# Patient Record
Sex: Male | Born: 1940
Health system: Southern US, Community
[De-identification: ages and names within clinical notes are randomized; demographics above are authoritative.]

## PROBLEM LIST (undated history)

## (undated) DIAGNOSIS — C189 Malignant neoplasm of colon, unspecified: Secondary | ICD-10-CM

## (undated) HISTORY — PX: COLON SURGERY: SHX602

## (undated) HISTORY — PX: COLECTOMY: SHX59

## (undated) HISTORY — PX: COLOSTOMY: SHX63

---

## 2009-03-28 ENCOUNTER — Ambulatory Visit: Payer: Self-pay | Admitting: Diagnostic Radiology

## 2009-03-28 ENCOUNTER — Emergency Department (HOSPITAL_BASED_OUTPATIENT_CLINIC_OR_DEPARTMENT_OTHER): Admission: EM | Admit: 2009-03-28 | Discharge: 2009-03-28 | Payer: Self-pay | Admitting: Emergency Medicine

## 2009-06-03 ENCOUNTER — Emergency Department (HOSPITAL_BASED_OUTPATIENT_CLINIC_OR_DEPARTMENT_OTHER): Admission: EM | Admit: 2009-06-03 | Discharge: 2009-06-04 | Payer: Self-pay | Admitting: Emergency Medicine

## 2009-06-04 ENCOUNTER — Ambulatory Visit: Payer: Self-pay | Admitting: Diagnostic Radiology

## 2010-04-14 ENCOUNTER — Ambulatory Visit: Payer: Self-pay | Admitting: Diagnostic Radiology

## 2010-04-14 ENCOUNTER — Emergency Department (HOSPITAL_BASED_OUTPATIENT_CLINIC_OR_DEPARTMENT_OTHER): Admission: EM | Admit: 2010-04-14 | Discharge: 2010-04-14 | Payer: Self-pay | Admitting: Emergency Medicine

## 2010-05-15 ENCOUNTER — Ambulatory Visit: Payer: Self-pay | Admitting: Family Medicine

## 2010-05-15 DIAGNOSIS — R079 Chest pain, unspecified: Secondary | ICD-10-CM | POA: Insufficient documentation

## 2010-05-15 DIAGNOSIS — S20219A Contusion of unspecified front wall of thorax, initial encounter: Secondary | ICD-10-CM

## 2010-05-15 DIAGNOSIS — I10 Essential (primary) hypertension: Secondary | ICD-10-CM | POA: Insufficient documentation

## 2010-05-15 DIAGNOSIS — E109 Type 1 diabetes mellitus without complications: Secondary | ICD-10-CM | POA: Insufficient documentation

## 2010-05-18 ENCOUNTER — Telehealth (INDEPENDENT_AMBULATORY_CARE_PROVIDER_SITE_OTHER): Payer: Self-pay | Admitting: *Deleted

## 2010-05-22 ENCOUNTER — Ambulatory Visit: Payer: Self-pay | Admitting: Emergency Medicine

## 2010-05-22 DIAGNOSIS — S335XXA Sprain of ligaments of lumbar spine, initial encounter: Secondary | ICD-10-CM

## 2010-10-03 NOTE — Progress Notes (Signed)
  Phone Note Outgoing Call Call back at [facility] Phone [phone]   Call placed by: Lajean Saver RN,  May 18, 2010 6:11 PM Call placed to: Patient  Follow-up for Phone Call        No answer. I left a message with reason for call. Call with any questions or concerns Follow-up by: Lajean Saver RN,  May 18, 2010 6:11 PM
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
## 2010-12-07 LAB — DIFFERENTIAL
Basophils Relative: 0 % (ref 0–1)
Eosinophils Absolute: 0 10*3/uL (ref 0.0–0.7)
Eosinophils Relative: 0 % (ref 0–5)
Lymphocytes Relative: 15 % (ref 12–46)
Lymphs Abs: 1.3 10*3/uL (ref 0.7–4.0)
Monocytes Absolute: 0.7 10*3/uL (ref 0.1–1.0)
Neutro Abs: 6.8 10*3/uL (ref 1.7–7.7)
Neutrophils Relative %: 77 % (ref 43–77)

## 2010-12-07 LAB — CBC
HCT: 40.4 % (ref 39.0–52.0)
Hemoglobin: 12.7 g/dL — ABNORMAL LOW (ref 13.0–17.0)
RDW: 15.8 % — ABNORMAL HIGH (ref 11.5–15.5)
WBC: 8.8 10*3/uL (ref 4.0–10.5)

## 2010-12-07 LAB — COMPREHENSIVE METABOLIC PANEL
BUN: 13 mg/dL (ref 6–23)
Calcium: 9.1 mg/dL (ref 8.4–10.5)
Creatinine, Ser: 1 mg/dL (ref 0.4–1.5)
Potassium: 3.6 mEq/L (ref 3.5–5.1)
Total Bilirubin: 0.3 mg/dL (ref 0.3–1.2)

## 2010-12-07 LAB — URINALYSIS, ROUTINE W REFLEX MICROSCOPIC
Bilirubin Urine: NEGATIVE
Glucose, UA: NEGATIVE mg/dL
pH: 6 (ref 5.0–8.0)

## 2010-12-10 LAB — DIFFERENTIAL
Basophils Relative: 1 % (ref 0–1)
Eosinophils Absolute: 0.1 10*3/uL (ref 0.0–0.7)
Lymphocytes Relative: 20 % (ref 12–46)
Lymphs Abs: 1.6 10*3/uL (ref 0.7–4.0)
Monocytes Absolute: 0.6 10*3/uL (ref 0.1–1.0)
Monocytes Relative: 8 % (ref 3–12)

## 2010-12-10 LAB — URINALYSIS, ROUTINE W REFLEX MICROSCOPIC
Glucose, UA: NEGATIVE mg/dL
Protein, ur: NEGATIVE mg/dL

## 2010-12-10 LAB — COMPREHENSIVE METABOLIC PANEL
ALT: 8 U/L (ref 0–53)
Albumin: 3.2 g/dL — ABNORMAL LOW (ref 3.5–5.2)
Alkaline Phosphatase: 76 U/L (ref 39–117)
CO2: 26 mEq/L (ref 19–32)
Calcium: 8.8 mg/dL (ref 8.4–10.5)
Chloride: 108 mEq/L (ref 96–112)
Creatinine, Ser: 0.8 mg/dL (ref 0.4–1.5)
GFR calc Af Amer: >60 mL/min (ref >60)
GFR calc non Af Amer: >60 mL/min (ref >60)
Potassium: 4.1 mEq/L (ref 3.5–5.1)
Total Bilirubin: 0.1 mg/dL — ABNORMAL LOW (ref 0.3–1.2)
Total Protein: 6.6 g/dL (ref 6.0–8.3)

## 2010-12-10 LAB — CBC
HCT: 33 % — ABNORMAL LOW (ref 39.0–52.0)
Platelets: 264 10*3/uL (ref 150–400)
RDW: 14.9 % (ref 11.5–15.5)
WBC: 7.7 10*3/uL (ref 4.0–10.5)

## 2010-12-10 LAB — APTT: aPTT: 31 seconds (ref 24–37)

## 2010-12-10 LAB — PROTIME-INR: INR: 1.1 (ref 0.00–1.49)

## 2012-01-06 ENCOUNTER — Emergency Department (INDEPENDENT_AMBULATORY_CARE_PROVIDER_SITE_OTHER): Payer: Medicare Other

## 2012-01-06 ENCOUNTER — Emergency Department (HOSPITAL_BASED_OUTPATIENT_CLINIC_OR_DEPARTMENT_OTHER)
Admission: EM | Admit: 2012-01-06 | Discharge: 2012-01-06 | Discharge status: Against Medical Advice | Payer: Medicare Other | Attending: Emergency Medicine | Admitting: Emergency Medicine

## 2012-01-06 ENCOUNTER — Encounter (HOSPITAL_BASED_OUTPATIENT_CLINIC_OR_DEPARTMENT_OTHER): Payer: Self-pay | Admitting: *Deleted

## 2012-01-06 DIAGNOSIS — R05 Cough: Secondary | ICD-10-CM | POA: Insufficient documentation

## 2012-01-06 DIAGNOSIS — R509 Fever, unspecified: Secondary | ICD-10-CM | POA: Insufficient documentation

## 2012-01-06 DIAGNOSIS — R0602 Shortness of breath: Secondary | ICD-10-CM | POA: Insufficient documentation

## 2012-01-06 DIAGNOSIS — Z933 Colostomy status: Secondary | ICD-10-CM | POA: Insufficient documentation

## 2012-01-06 DIAGNOSIS — R059 Cough, unspecified: Secondary | ICD-10-CM

## 2012-01-06 DIAGNOSIS — R0682 Tachypnea, not elsewhere classified: Secondary | ICD-10-CM | POA: Insufficient documentation

## 2012-01-06 DIAGNOSIS — I517 Cardiomegaly: Secondary | ICD-10-CM

## 2012-01-06 DIAGNOSIS — R350 Frequency of micturition: Secondary | ICD-10-CM | POA: Insufficient documentation

## 2012-01-06 DIAGNOSIS — J189 Pneumonia, unspecified organism: Secondary | ICD-10-CM | POA: Insufficient documentation

## 2012-01-06 HISTORY — DX: Malignant neoplasm of colon, unspecified: C18.9

## 2012-01-06 LAB — URINALYSIS, ROUTINE W REFLEX MICROSCOPIC
Bilirubin Urine: NEGATIVE
Nitrite: NEGATIVE
Urobilinogen, UA: 0.2 mg/dL (ref 0.0–1.0)

## 2012-01-06 LAB — DIFFERENTIAL
Basophils Absolute: 0.1 10*3/uL (ref 0.0–0.1)
Basophils Relative: 1 % (ref 0–1)
Lymphs Abs: 1.6 10*3/uL (ref 0.7–4.0)
Monocytes Absolute: 0.5 10*3/uL (ref 0.1–1.0)
Monocytes Relative: 4 % (ref 3–12)
Neutro Abs: 8.9 10*3/uL — ABNORMAL HIGH (ref 1.7–7.7)

## 2012-01-06 LAB — COMPREHENSIVE METABOLIC PANEL
ALT: 9 U/L (ref 0–53)
Albumin: 3.9 g/dL (ref 3.5–5.2)
Alkaline Phosphatase: 75 U/L (ref 39–117)
Calcium: 9.4 mg/dL (ref 8.4–10.5)
Chloride: 104 mEq/L (ref 96–112)
Creatinine, Ser: 0.8 mg/dL (ref 0.50–1.35)
GFR calc non Af Amer: 88 mL/min — ABNORMAL LOW (ref >90)
Potassium: 3.5 mEq/L (ref 3.5–5.1)
Sodium: 142 mEq/L (ref 135–145)
Total Protein: 7.1 g/dL (ref 6.0–8.3)

## 2012-01-06 LAB — CARDIAC PANEL(CRET KIN+CKTOT+MB+TROPI)
CK, MB: 3.2 ng/mL (ref 0.3–4.0)
Troponin I: <0.3 ng/mL (ref <0.30)

## 2012-01-06 MED ORDER — ALBUTEROL SULFATE HFA 108 (90 BASE) MCG/ACT IN AERS
2.0000 | INHALATION_SPRAY | RESPIRATORY_TRACT | Status: DC | PRN
  Administered 2012-01-06: 2 via RESPIRATORY_TRACT
  Filled 2012-01-06: qty 6.7

## 2012-01-06 MED ORDER — MOXIFLOXACIN HCL IN NACL 400 MG/250ML IV SOLN
400.0000 mg | Freq: Once | INTRAVENOUS | Status: AC
  Administered 2012-01-06: 400 mg via INTRAVENOUS
  Filled 2012-01-06: qty 250

## 2012-01-06 MED ORDER — MOXIFLOXACIN HCL 400 MG PO TABS: 400.0000 mg | ORAL_TABLET | Freq: Every day | ORAL | Status: AC

## 2012-01-06 MED ORDER — GUAIFENESIN 100 MG/5ML PO LIQD: 100.0000 mg | ORAL | Status: AC | PRN

## 2012-01-06 MED ORDER — ALBUTEROL SULFATE (5 MG/ML) 0.5% IN NEBU
5.0000 mg | INHALATION_SOLUTION | Freq: Once | RESPIRATORY_TRACT | Status: AC
  Administered 2012-01-06: 5 mg via RESPIRATORY_TRACT
  Filled 2012-01-06: qty 1

## 2012-01-06 NOTE — ED Notes (Signed)
MD informed patient that she would prefer he be admitted to hospital to continue treatment, but patient refused, education given on taking medications and instructed to contact MD or ER if condition worsens. Patient and family verbalized consent

## 2012-01-06 NOTE — Discharge Instructions (Signed)

## 2012-01-06 NOTE — ED Provider Notes (Addendum)
History     CSN: [id number]  Arrival date & time 01/06/12  0824   First MD Initiated Contact with Patient 01/06/12 0831      Chief Complaint  Patient presents with  . Cough  . Urinary Tract Infection    (Consider location/radiation/quality/duration/timing/severity/associated sxs/prior treatment) Patient is a 71 y.o. male presenting with cough and urinary tract infection. The history is provided by the patient.  Cough This is a new problem. The current episode started yesterday. The problem occurs constantly. The problem has been gradually worsening. The cough is non-productive. There has been no fever. Associated symptoms include chills, shortness of breath and wheezing. Pertinent negatives include no chest pain, no rhinorrhea and no sore throat. He has tried decongestants for the symptoms. The treatment provided no relief. Risk factors: No recent travel or other risk factors. He is not a smoker (Stopped smoking 20 years ago). His past medical history does not include bronchitis, COPD or asthma.  Urinary Tract Infection This is a new (Urinary urgency and frequency but no dysuria) problem. The current episode started 2 days ago. The problem occurs constantly. The problem has not changed since onset.Associated symptoms include shortness of breath. Pertinent negatives include no chest pain and no abdominal pain. The symptoms are aggravated by nothing. The symptoms are relieved by nothing. He has tried nothing for the symptoms. The treatment provided no relief.    No past medical history on file.  No past surgical history on file.  No family history on file.  History  Substance Use Topics  . Smoking status: Not on file  . Smokeless tobacco: Not on file  . Alcohol Use: Not on file      Review of Systems  Constitutional: Positive for chills.  HENT: Negative for congestion, sore throat and rhinorrhea.   Respiratory: Positive for cough, shortness of breath and wheezing.     Cardiovascular: Negative for chest pain, palpitations and leg swelling.  Gastrointestinal: Negative for nausea, vomiting, abdominal pain and diarrhea.  Genitourinary: Positive for urgency and frequency. Negative for dysuria.  Neurological: Negative for weakness.  All other systems reviewed and are negative.    Allergies  Review of patient's allergies indicates not on file.  Home Medications  No current outpatient prescriptions on file.  BP 160/87  Pulse 100  Temp(Src) 99.3 F (37.4 C) (Oral)  Resp 20  SpO2 94%  Physical Exam  Nursing note and vitals reviewed. Constitutional: He is oriented to person, place, and time. He appears well-developed and well-nourished. He appears distressed.  HENT:  Head: Normocephalic and atraumatic.  Mouth/Throat: Oropharynx is clear and moist.  Eyes: Conjunctivae and EOM are normal. Pupils are equal, round, and reactive to light.  Neck: Normal range of motion. Neck supple.  Cardiovascular: Normal rate, regular rhythm and intact distal pulses.   No murmur heard. Pulmonary/Chest: Tachypnea noted. No respiratory distress. He has wheezes. He has rhonchi. He has no rales.  Abdominal: Soft. He exhibits no distension. There is no tenderness. There is no rebound and no guarding.       Ostomy present in the left lower quadrant with normal stool in the bag  Musculoskeletal: Normal range of motion. He exhibits no edema and no tenderness.  Neurological: He is alert and oriented to person, place, and time.  Skin: Skin is warm and dry. No rash noted. No erythema.  Psychiatric: He has a normal mood and affect. His behavior is normal.    ED Course  Procedures (including critical care time)  Labs Reviewed  CBC - Abnormal; Notable for the following:    WBC 11.1 (*)    All other components within normal limits  DIFFERENTIAL - Abnormal; Notable for the following:    Neutrophils Relative 80 (*)    Neutro Abs 8.9 (*)    All other components within normal  limits  COMPREHENSIVE METABOLIC PANEL - Abnormal; Notable for the following:    Glucose, Bld 141 (*)    GFR calc non Af Amer 88 (*)    All other components within normal limits  URINALYSIS, ROUTINE W REFLEX MICROSCOPIC  CARDIAC PANEL(CRET KIN+CKTOT+MB+TROPI)   Dg Chest 2 View  01/06/2012  *RADIOLOGY REPORT*  Clinical Data: Cough  CHEST - 2 VIEW  Comparison: 05/15/2010  Findings: Right IJ PowerPort stable.  Linear scarring or atelectasis in the left lower lobe stable.  Heart size upper limits normal.  The right lung is clear.  No effusion.  Minimal spondylitic changes in the mid thoracic spine.  IMPRESSION:  1.  Stable mild cardiomegaly.  No acute disease.  Original Report Authenticated By: Osa Craver, M.D.     Date: 01/06/2012  Rate: 94  Rhythm: normal sinus rhythm  QRS Axis: normal  Intervals: normal  ST/T Wave abnormalities: nonspecific ST changes  Conduction Disutrbances:none  Narrative Interpretation:   Old EKG Reviewed: none available    1. CAP (community acquired pneumonia)       MDM   Patient with cough and shortness of breath that started yesterday. The cough is nonproductive however he has had febrile, tachycardic mildly hypoxic at 93-94% on room air today.  He should is audibly wheezing with crackles today diffusely on his lung exam. He has no abdominal pain or CVA tenderness and but states he feels he has a urinary tract infection because he's had urinary frequency. Patient denies any prior heart problems congestive heart failure and there is no sign of fluid overload such as JVD or peripheral edema. Feel patient's etiology is most likely reactive versus infectious. CBC, CMP, cardiac enzymes, UA, chest x-ray, EKG pending. Patient started on an albuterol nebulizer.  11:55 AM Tabs with a mild leukocytosis of 11,000 otherwise labs are unrevealing. Chest x-ray is within normal limits however field given patient's symptoms and exam that this is an early pneumonia  that is not become apparent on a chest x-ray.  Spoke with patient about feeling the need that he needs to be hospitalized due to his mild hypoxia on arrival and his persistent shortness of breath that had minimal improvement with albuterol. Still on exam now he has rhonchi and wheezing. However patient refuses to stay and wants to go home. He signed out AMA however he was given Avelox and inhaler.     Gwyneth Sprout, MD 01/06/12 1156  Gwyneth Sprout, MD 01/06/12 1158  Gwyneth Sprout, MD 01/06/12 1224

## 2012-01-06 NOTE — ED Notes (Signed)
Patient states that he started coughing yesterday, hurts when coughing/gagging & states it has grown worse, no n/v, he states that he feels like he has a UTI, hard to catch his breath

## 2012-11-09 ENCOUNTER — Emergency Department (HOSPITAL_BASED_OUTPATIENT_CLINIC_OR_DEPARTMENT_OTHER)
Admission: EM | Admit: 2012-11-09 | Discharge: 2012-11-09 | Disposition: A | Discharge status: Home / Self Care | Payer: Medicare Other | Attending: Emergency Medicine | Admitting: Emergency Medicine

## 2012-11-09 ENCOUNTER — Encounter (HOSPITAL_BASED_OUTPATIENT_CLINIC_OR_DEPARTMENT_OTHER): Payer: Self-pay

## 2012-11-09 ENCOUNTER — Emergency Department (HOSPITAL_BASED_OUTPATIENT_CLINIC_OR_DEPARTMENT_OTHER): Payer: Medicare Other

## 2012-11-09 DIAGNOSIS — Z85038 Personal history of other malignant neoplasm of large intestine: Secondary | ICD-10-CM | POA: Insufficient documentation

## 2012-11-09 DIAGNOSIS — Z87891 Personal history of nicotine dependence: Secondary | ICD-10-CM | POA: Insufficient documentation

## 2012-11-09 DIAGNOSIS — E119 Type 2 diabetes mellitus without complications: Secondary | ICD-10-CM | POA: Insufficient documentation

## 2012-11-09 DIAGNOSIS — M545 Low back pain, unspecified: Secondary | ICD-10-CM | POA: Insufficient documentation

## 2012-11-09 DIAGNOSIS — G8929 Other chronic pain: Secondary | ICD-10-CM | POA: Insufficient documentation

## 2012-11-09 DIAGNOSIS — Z79899 Other long term (current) drug therapy: Secondary | ICD-10-CM | POA: Insufficient documentation

## 2012-11-09 DIAGNOSIS — R109 Unspecified abdominal pain: Secondary | ICD-10-CM

## 2012-11-09 DIAGNOSIS — R52 Pain, unspecified: Secondary | ICD-10-CM | POA: Insufficient documentation

## 2012-11-09 LAB — COMPREHENSIVE METABOLIC PANEL
ALT: 7 U/L (ref 0–53)
AST: 15 U/L (ref 0–37)
Albumin: 3.7 g/dL (ref 3.5–5.2)
Alkaline Phosphatase: 65 U/L (ref 39–117)
BUN: 12 mg/dL (ref 6–23)
CO2: 26 mEq/L (ref 19–32)
Calcium: 9.6 mg/dL (ref 8.4–10.5)
Chloride: 102 mEq/L (ref 96–112)
Creatinine, Ser: 0.8 mg/dL (ref 0.50–1.35)
GFR calc Af Amer: >90 mL/min (ref >90)
GFR calc non Af Amer: 88 mL/min — ABNORMAL LOW (ref >90)
Glucose, Bld: 131 mg/dL — ABNORMAL HIGH (ref 70–99)
Potassium: 4.1 mEq/L (ref 3.5–5.1)
Sodium: 139 mEq/L (ref 135–145)
Total Bilirubin: 0.4 mg/dL (ref 0.3–1.2)
Total Protein: 7.3 g/dL (ref 6.0–8.3)

## 2012-11-09 LAB — CBC WITH DIFFERENTIAL/PLATELET
Basophils Absolute: 0 10*3/uL (ref 0.0–0.1)
Basophils Relative: 0 % (ref 0–1)
Eosinophils Absolute: 0.1 10*3/uL (ref 0.0–0.7)
Eosinophils Relative: 1 % (ref 0–5)
HCT: 44.9 % (ref 39.0–52.0)
Hemoglobin: 15 g/dL (ref 13.0–17.0)
Lymphocytes Relative: 22 % (ref 12–46)
Lymphs Abs: 1.6 10*3/uL (ref 0.7–4.0)
MCH: 27.6 pg (ref 26.0–34.0)
MCHC: 33.4 g/dL (ref 30.0–36.0)
MCV: 82.5 fL (ref 78.0–100.0)
Monocytes Absolute: 0.4 10*3/uL (ref 0.1–1.0)
Monocytes Relative: 6 % (ref 3–12)
Neutro Abs: 5.1 10*3/uL (ref 1.7–7.7)
Neutrophils Relative %: 71 % (ref 43–77)
Platelets: 194 10*3/uL (ref 150–400)
RBC: 5.44 MIL/uL (ref 4.22–5.81)
RDW: 14.2 % (ref 11.5–15.5)
WBC: 7.2 10*3/uL (ref 4.0–10.5)

## 2012-11-09 LAB — URINALYSIS, ROUTINE W REFLEX MICROSCOPIC
Bilirubin Urine: NEGATIVE
Hgb urine dipstick: NEGATIVE
Ketones, ur: NEGATIVE mg/dL
Specific Gravity, Urine: 1.023 (ref 1.005–1.030)
pH: 6 (ref 5.0–8.0)

## 2012-11-09 MED ORDER — ONDANSETRON HCL 4 MG/2ML IJ SOLN
INTRAMUSCULAR | Status: AC
  Filled 2012-11-09: qty 2

## 2012-11-09 MED ORDER — IOHEXOL 300 MG/ML  SOLN
50.0000 mL | Freq: Once | INTRAMUSCULAR | Status: AC | PRN
  Administered 2012-11-09: 50 mL via ORAL

## 2012-11-09 MED ORDER — IOHEXOL 300 MG/ML  SOLN
100.0000 mL | Freq: Once | INTRAMUSCULAR | Status: AC | PRN
  Administered 2012-11-09: 100 mL via INTRAVENOUS

## 2012-11-09 MED ORDER — OXYCODONE-ACETAMINOPHEN 5-325 MG PO TABS: 1.0000 | ORAL_TABLET | Freq: Four times a day (QID) | ORAL | Status: DC | PRN

## 2012-11-09 MED ORDER — FENTANYL CITRATE 0.05 MG/ML IJ SOLN
100.0000 ug | Freq: Once | INTRAMUSCULAR | Status: AC
  Administered 2012-11-09: 100 ug via INTRAVENOUS
  Filled 2012-11-09: qty 2

## 2012-11-09 MED ORDER — SODIUM CHLORIDE 0.9 % IV BOLUS (SEPSIS)
1000.0000 mL | Freq: Once | INTRAVENOUS | Status: AC
  Administered 2012-11-09: 1000 mL via INTRAVENOUS

## 2012-11-09 MED ORDER — ONDANSETRON HCL 4 MG/2ML IJ SOLN
4.0000 mg | Freq: Once | INTRAMUSCULAR | Status: AC
  Administered 2012-11-09: 4 mg via INTRAVENOUS
  Filled 2012-11-09: qty 2

## 2012-11-09 NOTE — ED Provider Notes (Signed)
History     CSN: [id number]  Arrival date & time 11/09/12  1221   First MD Initiated Contact with Patient 11/09/12 1359      Chief Complaint  Patient presents with  . Flank Pain    (Consider location/radiation/quality/duration/timing/severity/associated sxs/prior treatment) HPI Patient presents to the emergency department with right side pain.  Patient, states, that this began yesterday, but got worse this morning.  Patient, states, that he is on chronic pain medications.  Patient, states he did not have nausea, vomiting, diarrhea, fever, chest pain, shortness breath, weakness, numbness, dysuria, dizziness, or syncope.  Patient, states, that he has a history of colon cancer.  Patient denies any relieving factors.  Past Medical History  Diagnosis Date  . Colon cancer   . Diabetes mellitus     Past Surgical History  Procedure Laterality Date  . Colon surgery    . Colectomy    . Colectomy      History reviewed. No pertinent family history.  History  Substance Use Topics  . Smoking status: Former Games developer  . Smokeless tobacco: Not on file  . Alcohol Use: No      Review of Systems All other systems negative except as documented in the HPI. All pertinent positives and negatives as reviewed in the HPI. Allergies  Review of patient's allergies indicates no known allergies.  Home Medications   Current Outpatient Rx  Name  Route  Sig  Dispense  Refill  . citalopram (CELEXA) 20 MG tablet   Oral   Take 20 mg by mouth daily.         . fentaNYL (DURAGESIC - DOSED MCG/HR) 50 MCG/HR   Transdermal   Place 1 patch onto the skin every 3 (three) days.         . ferrous sulfate 325 (65 FE) MG tablet   Oral   Take 325 mg by mouth daily with breakfast.         . gabapentin (NEURONTIN) 300 MG capsule   Oral   Take 300 mg by mouth 3 (three) times daily.         Marland Kitchen glipiZIDE (GLUCOTROL) 5 MG tablet   Oral   Take 5 mg by mouth 2 (two) times daily before a meal.         . HYDROcodone-acetaminophen (NORCO) 5-325 MG per tablet   Oral   Take 1 tablet by mouth every 4 (four) hours as needed.         Marland Kitchen lisinopril (PRINIVIL,ZESTRIL) 10 MG tablet   Oral   Take 10 mg by mouth daily.         Marland Kitchen loperamide (IMODIUM) 2 MG capsule   Oral   Take 2 mg by mouth 4 (four) times daily as needed.         . methadone (DOLOPHINE) 5 MG tablet   Oral   Take 5 mg by mouth 2 (two) times daily.         Marland Kitchen omeprazole (PRILOSEC) 20 MG capsule   Oral   Take 20 mg by mouth daily.           BP 117/75  Pulse 58  Temp(Src) 98.1 F (36.7 C) (Oral)  Resp 18  Ht 5\' 9"  (1.753 m)  Wt 203 lb (92.08 kg)  BMI 29.96 kg/m2  SpO2 96%  Physical Exam  Constitutional: He is oriented to person, place, and time. He appears well-developed and well-nourished. No distress.  HENT:  Head: Normocephalic and atraumatic.  Mouth/Throat: Oropharynx  is clear and moist.  Neck: Normal range of motion. Neck supple.  Cardiovascular: Normal rate, regular rhythm and normal heart sounds.  Exam reveals no gallop and no friction rub.   No murmur heard. Pulmonary/Chest: Effort normal and breath sounds normal. No respiratory distress. He has no wheezes. He has no rales.  Abdominal: Soft. Normal appearance and bowel sounds are normal. He exhibits no distension. There is no hepatosplenomegaly. There is no tenderness. There is no guarding and no CVA tenderness.    Neurological: He is alert and oriented to person, place, and time.    ED Course  Procedures (including critical care time)  Labs Reviewed  COMPREHENSIVE METABOLIC PANEL - Abnormal; Notable for the following:    Glucose, Bld 131 (*)    GFR calc non Af Amer 88 (*)    All other components within normal limits  URINALYSIS, ROUTINE W REFLEX MICROSCOPIC  CBC WITH DIFFERENTIAL   Ct Abdomen Pelvis W Contrast  11/09/2012  *RADIOLOGY REPORT*  Clinical Data: Right flank pain, nausea.  History of colon cancer status post resection.  CT  ABDOMEN AND PELVIS WITH CONTRAST  Technique:  Multidetector CT imaging of the abdomen and pelvis was performed following the standard protocol during bolus administration of intravenous contrast.  Contrast: 100 ml Omnipaque-300 IV  Comparison: 06/04/2009  Findings: Lung bases are clear.  8 mm probable cyst in the lateral segment left hepatic lobe (series 2/image 8).  Spleen and adrenal glands are within normal limits.  10 mm cystic lesion in the pancreatic tail (series 2/image 22). This appear is more conspicuous on the current study but was likely present in 2010.  No associated main pancreatic ductal dilatation.  Gallbladder is unremarkable.  No intrahepatic or extrahepatic ductal dilatation.  Tiny cortical defect/cyst in the lateral right lower kidney (series 2/image 27).  Left renal sinus cysts.  No hydronephrosis.  No evidence of bowel obstruction.  Normal appendix.  Left lower quadrant colostomy with Hartmann's pouch.  Colonic diverticulosis, without associated inflammatory changes.  No evidence of abdominal aortic aneurysm.  Retroaortic left renal vein.  No abdominopelvic ascites.  No suspicious abdominopelvic lymphadenopathy.  Prostatomegaly, measuring 5.8 cm, and indenting the base of the bladder.  No ureteral or bladder calculi.  Bladder is within normal limits.  Moderate fat-containing right inguinal hernia with mild stranding, unchanged.  Small fat-containing left lower hernia.  Degenerative changes of the visualized thoracolumbar spine.  IMPRESSION: No evidence of bowel obstruction.  Normal appendix.  Left lower quadrant colostomy with Hartmann's pouch.  10 mm nonaggressive cystic lesion in the pancreatic tail, similar to 2010, likely benign.  Consider a single follow-up MRI/MCRP abdomen with/without contrast in 12 months if clinically warranted.  No findings to suggest metastatic disease in the abdomen/pelvis.   Original Report Authenticated By: Charline Bills, M.D.      Recheck.  Patient.  X3.   The patient is feeling better and would like to go home at this time.  Patient is advised followup with his primary care Dr. told to return here as needed.  Patient is advised to slowly increase his fluid intake.  Also vessel giving limits quality of pain medication as needed.  Is most likely related to pain.  The patient has had previously in this area MDM  MDM Reviewed: vitals and nursing note Interpretation: labs and CT scan            Carlyle Dolly, PA-C 11/09/12 1719  Carlyle Dolly, PA-C 11/09/12 1723

## 2012-11-09 NOTE — ED Provider Notes (Signed)
Medical screening examination/treatment/procedure(s) were performed by non-physician practitioner and as supervising physician I was immediately available for consultation/collaboration.  Geoffery Lyons, MD 11/09/12 [phone]

## 2012-11-09 NOTE — ED Notes (Signed)
Pt states that he has been helping put carpet down this past week,  Onset of R flank pain 4 days ago.  Pt states that pain has increased gradually over the past four days.  Pt denies urinary symptoms.

## 2012-11-09 NOTE — ED Notes (Signed)
Patient transported to CT 

## 2012-11-09 NOTE — ED Notes (Signed)
Pt back from CT

## 2015-09-14 DIAGNOSIS — E785 Hyperlipidemia, unspecified: Secondary | ICD-10-CM | POA: Diagnosis not present

## 2015-09-14 DIAGNOSIS — R0989 Other specified symptoms and signs involving the circulatory and respiratory systems: Secondary | ICD-10-CM | POA: Diagnosis not present

## 2015-09-14 DIAGNOSIS — I10 Essential (primary) hypertension: Secondary | ICD-10-CM | POA: Diagnosis not present

## 2015-09-14 DIAGNOSIS — E119 Type 2 diabetes mellitus without complications: Secondary | ICD-10-CM | POA: Diagnosis not present

## 2015-09-14 DIAGNOSIS — R05 Cough: Secondary | ICD-10-CM | POA: Diagnosis not present

## 2015-09-15 DIAGNOSIS — C786 Secondary malignant neoplasm of retroperitoneum and peritoneum: Secondary | ICD-10-CM | POA: Diagnosis not present

## 2015-09-15 DIAGNOSIS — G894 Chronic pain syndrome: Secondary | ICD-10-CM | POA: Diagnosis not present

## 2015-09-15 DIAGNOSIS — Z79891 Long term (current) use of opiate analgesic: Secondary | ICD-10-CM | POA: Diagnosis not present

## 2015-09-15 DIAGNOSIS — Z9221 Personal history of antineoplastic chemotherapy: Secondary | ICD-10-CM | POA: Diagnosis not present

## 2015-09-15 DIAGNOSIS — C189 Malignant neoplasm of colon, unspecified: Secondary | ICD-10-CM | POA: Diagnosis not present

## 2015-09-15 DIAGNOSIS — Z79899 Other long term (current) drug therapy: Secondary | ICD-10-CM | POA: Diagnosis not present

## 2015-09-15 DIAGNOSIS — R05 Cough: Secondary | ICD-10-CM | POA: Diagnosis not present

## 2015-09-15 DIAGNOSIS — Z9889 Other specified postprocedural states: Secondary | ICD-10-CM | POA: Diagnosis not present

## 2015-09-28 DIAGNOSIS — H401132 Primary open-angle glaucoma, bilateral, moderate stage: Secondary | ICD-10-CM | POA: Diagnosis not present

## 2015-10-13 DIAGNOSIS — J069 Acute upper respiratory infection, unspecified: Secondary | ICD-10-CM | POA: Diagnosis not present

## 2015-10-13 DIAGNOSIS — E119 Type 2 diabetes mellitus without complications: Secondary | ICD-10-CM | POA: Diagnosis not present

## 2015-10-14 DIAGNOSIS — Z933 Colostomy status: Secondary | ICD-10-CM | POA: Diagnosis not present

## 2015-11-10 DIAGNOSIS — Z79899 Other long term (current) drug therapy: Secondary | ICD-10-CM | POA: Diagnosis not present

## 2015-11-10 DIAGNOSIS — C189 Malignant neoplasm of colon, unspecified: Secondary | ICD-10-CM | POA: Diagnosis not present

## 2015-12-07 DIAGNOSIS — G894 Chronic pain syndrome: Secondary | ICD-10-CM | POA: Diagnosis not present

## 2015-12-07 DIAGNOSIS — G893 Neoplasm related pain (acute) (chronic): Secondary | ICD-10-CM | POA: Diagnosis not present

## 2015-12-07 DIAGNOSIS — Z79891 Long term (current) use of opiate analgesic: Secondary | ICD-10-CM | POA: Diagnosis not present

## 2015-12-07 DIAGNOSIS — C189 Malignant neoplasm of colon, unspecified: Secondary | ICD-10-CM | POA: Diagnosis not present

## 2015-12-07 DIAGNOSIS — C786 Secondary malignant neoplasm of retroperitoneum and peritoneum: Secondary | ICD-10-CM | POA: Diagnosis not present

## 2015-12-07 DIAGNOSIS — Z79899 Other long term (current) drug therapy: Secondary | ICD-10-CM | POA: Diagnosis not present

## 2015-12-29 DIAGNOSIS — Z452 Encounter for adjustment and management of vascular access device: Secondary | ICD-10-CM | POA: Diagnosis not present

## 2015-12-29 DIAGNOSIS — C189 Malignant neoplasm of colon, unspecified: Secondary | ICD-10-CM | POA: Diagnosis not present

## 2016-01-03 DIAGNOSIS — Z933 Colostomy status: Secondary | ICD-10-CM | POA: Diagnosis not present

## 2016-02-08 DIAGNOSIS — H401132 Primary open-angle glaucoma, bilateral, moderate stage: Secondary | ICD-10-CM | POA: Diagnosis not present

## 2016-03-27 DIAGNOSIS — G893 Neoplasm related pain (acute) (chronic): Secondary | ICD-10-CM | POA: Diagnosis not present

## 2016-03-27 DIAGNOSIS — Z933 Colostomy status: Secondary | ICD-10-CM | POA: Diagnosis not present

## 2016-03-27 DIAGNOSIS — C189 Malignant neoplasm of colon, unspecified: Secondary | ICD-10-CM | POA: Diagnosis not present

## 2016-03-27 DIAGNOSIS — Z79899 Other long term (current) drug therapy: Secondary | ICD-10-CM | POA: Diagnosis not present

## 2016-03-27 DIAGNOSIS — G894 Chronic pain syndrome: Secondary | ICD-10-CM | POA: Diagnosis not present

## 2016-03-27 DIAGNOSIS — C786 Secondary malignant neoplasm of retroperitoneum and peritoneum: Secondary | ICD-10-CM | POA: Diagnosis not present

## 2016-03-27 DIAGNOSIS — Z888 Allergy status to other drugs, medicaments and biological substances status: Secondary | ICD-10-CM | POA: Diagnosis not present

## 2016-03-27 DIAGNOSIS — Z79891 Long term (current) use of opiate analgesic: Secondary | ICD-10-CM | POA: Diagnosis not present

## 2016-05-20 ENCOUNTER — Emergency Department (HOSPITAL_BASED_OUTPATIENT_CLINIC_OR_DEPARTMENT_OTHER): Payer: Medicare Other

## 2016-05-20 ENCOUNTER — Emergency Department (HOSPITAL_BASED_OUTPATIENT_CLINIC_OR_DEPARTMENT_OTHER)
Admission: EM | Admit: 2016-05-20 | Discharge: 2016-05-20 | Disposition: A | Discharge status: Home / Self Care | Payer: Medicare Other | Attending: Emergency Medicine | Admitting: Emergency Medicine

## 2016-05-20 ENCOUNTER — Encounter (HOSPITAL_BASED_OUTPATIENT_CLINIC_OR_DEPARTMENT_OTHER): Payer: Self-pay | Admitting: Emergency Medicine

## 2016-05-20 DIAGNOSIS — D492 Neoplasm of unspecified behavior of bone, soft tissue, and skin: Secondary | ICD-10-CM | POA: Diagnosis not present

## 2016-05-20 DIAGNOSIS — Z79899 Other long term (current) drug therapy: Secondary | ICD-10-CM | POA: Diagnosis not present

## 2016-05-20 DIAGNOSIS — M25552 Pain in left hip: Secondary | ICD-10-CM | POA: Diagnosis not present

## 2016-05-20 DIAGNOSIS — C7951 Secondary malignant neoplasm of bone: Secondary | ICD-10-CM | POA: Insufficient documentation

## 2016-05-20 DIAGNOSIS — I10 Essential (primary) hypertension: Secondary | ICD-10-CM | POA: Diagnosis not present

## 2016-05-20 DIAGNOSIS — Z7984 Long term (current) use of oral hypoglycemic drugs: Secondary | ICD-10-CM | POA: Insufficient documentation

## 2016-05-20 DIAGNOSIS — R110 Nausea: Secondary | ICD-10-CM | POA: Diagnosis not present

## 2016-05-20 DIAGNOSIS — C189 Malignant neoplasm of colon, unspecified: Secondary | ICD-10-CM | POA: Insufficient documentation

## 2016-05-20 DIAGNOSIS — R109 Unspecified abdominal pain: Secondary | ICD-10-CM | POA: Diagnosis present

## 2016-05-20 DIAGNOSIS — E119 Type 2 diabetes mellitus without complications: Secondary | ICD-10-CM | POA: Insufficient documentation

## 2016-05-20 DIAGNOSIS — R5383 Other fatigue: Secondary | ICD-10-CM | POA: Diagnosis not present

## 2016-05-20 LAB — COMPREHENSIVE METABOLIC PANEL
ALBUMIN: 2.9 g/dL — AB (ref 3.5–5.0)
ALT: 5 U/L — AB (ref 17–63)
AST: 13 U/L — AB (ref 15–41)
Alkaline Phosphatase: 82 U/L (ref 38–126)
Anion gap: 5 (ref 5–15)
BILIRUBIN TOTAL: 0.5 mg/dL (ref 0.3–1.2)
BUN: 10 mg/dL (ref 6–20)
CO2: 32 mmol/L (ref 22–32)
CREATININE: 0.73 mg/dL (ref 0.61–1.24)
Calcium: 9 mg/dL (ref 8.9–10.3)
Chloride: 103 mmol/L (ref 101–111)
GFR calc Af Amer: >60 mL/min (ref >60)
GFR calc non Af Amer: >60 mL/min (ref >60)
GLUCOSE: 108 mg/dL — AB (ref 65–99)
POTASSIUM: 3.9 mmol/L (ref 3.5–5.1)
Sodium: 140 mmol/L (ref 135–145)
TOTAL PROTEIN: 6.9 g/dL (ref 6.5–8.1)

## 2016-05-20 LAB — URINALYSIS, ROUTINE W REFLEX MICROSCOPIC
Bilirubin Urine: NEGATIVE
Glucose, UA: NEGATIVE mg/dL
Hgb urine dipstick: NEGATIVE
KETONES UR: NEGATIVE mg/dL
LEUKOCYTES UA: NEGATIVE
NITRITE: NEGATIVE
PROTEIN: NEGATIVE mg/dL
Specific Gravity, Urine: 1.022 (ref 1.005–1.030)
pH: 6.5 (ref 5.0–8.0)

## 2016-05-20 LAB — CBC WITH DIFFERENTIAL/PLATELET
BASOS ABS: 0 10*3/uL (ref 0.0–0.1)
BASOS PCT: 0 %
Eosinophils Absolute: 0.1 10*3/uL (ref 0.0–0.7)
Eosinophils Relative: 1 %
HEMATOCRIT: 33.9 % — AB (ref 39.0–52.0)
HEMOGLOBIN: 10.9 g/dL — AB (ref 13.0–17.0)
LYMPHS PCT: 18 %
Lymphs Abs: 1 10*3/uL (ref 0.7–4.0)
MCH: 25.5 pg — ABNORMAL LOW (ref 26.0–34.0)
MCHC: 32.2 g/dL (ref 30.0–36.0)
MCV: 79.2 fL (ref 78.0–100.0)
MONO ABS: 0.5 10*3/uL (ref 0.1–1.0)
Monocytes Relative: 10 %
NEUTROS ABS: 4 10*3/uL (ref 1.7–7.7)
NEUTROS PCT: 71 %
Platelets: 305 10*3/uL (ref 150–400)
RBC: 4.28 MIL/uL (ref 4.22–5.81)
RDW: 16.9 % — AB (ref 11.5–15.5)
WBC: 5.6 10*3/uL (ref 4.0–10.5)

## 2016-05-20 MED ORDER — KETOROLAC TROMETHAMINE 30 MG/ML IJ SOLN
10.0000 mg | Freq: Once | INTRAMUSCULAR | Status: AC
  Administered 2016-05-20: 9.9 mg via INTRAVENOUS
  Filled 2016-05-20: qty 1

## 2016-05-20 MED ORDER — MORPHINE SULFATE (PF) 4 MG/ML IV SOLN: 6.0000 mg | Freq: Once | INTRAVENOUS | Status: DC

## 2016-05-20 MED ORDER — HYDROCODONE-ACETAMINOPHEN 5-325 MG PO TABS: 1.0000 | ORAL_TABLET | ORAL | 0 refills | Status: AC | PRN

## 2016-05-20 NOTE — ED Notes (Signed)
MD at bedside. 

## 2016-05-20 NOTE — ED Triage Notes (Addendum)
Intermittent pain from L arm down to L leg. Pt has "pain pill" without relief. No injury.

## 2016-05-20 NOTE — ED Notes (Signed)
Pt able to slowly ambulate with no assistance.

## 2016-05-20 NOTE — ED Notes (Signed)
Patient transported to CT 

## 2016-05-20 NOTE — ED Provider Notes (Signed)
[facility] Provider Note   CSN: [id number] Arrival date & time: 05/20/16  1001     History   Chief Complaint Chief Complaint  Patient presents with  . L sided pain    HPI Dakota Malone is a 75 y.o. male.   Hip Pain  This is a new problem. The current episode started more than 1 week ago. The problem occurs daily. The problem has not changed since onset.Associated symptoms include abdominal pain (2/2 metastatic colon cancer). Pertinent negatives include no shortness of breath. Exacerbated by: rest. The symptoms are relieved by narcotics.    Past Medical History:  Diagnosis Date  . Colon cancer ([place])   . Diabetes mellitus     Patient Active Problem List   Diagnosis Date Noted  . LUMBAR SPRAIN AND STRAIN 05/22/2010  . DIABETES MELLITUS, TYPE I 05/15/2010  . HYPERTENSION 05/15/2010  . RIB PAIN, LEFT SIDED 05/15/2010  . CONTUSION, LEFT CHEST WALL 05/15/2010    Past Surgical History:  Procedure Laterality Date  . COLECTOMY    . COLECTOMY    . COLON SURGERY    . COLOSTOMY         Home Medications    Prior to Admission medications   Medication Sig Start Date End Date Taking? Authorizing Provider  citalopram (CELEXA) 20 MG tablet Take 20 mg by mouth daily.    Historical Provider, MD  fentaNYL (DURAGESIC - DOSED MCG/HR) 50 MCG/HR Place 1 patch onto the skin every 3 (three) days.    Historical Provider, MD  ferrous sulfate 325 (65 FE) MG tablet Take 325 mg by mouth daily with breakfast.    Historical Provider, MD  gabapentin (NEURONTIN) 300 MG capsule Take 300 mg by mouth 3 (three) times daily.    Historical Provider, MD  glipiZIDE (GLUCOTROL) 5 MG tablet Take 5 mg by mouth 2 (two) times daily before a meal.    Historical Provider, MD  HYDROcodone-acetaminophen (NORCO/VICODIN) 5-325 MG tablet Take 1 tablet by mouth every 4 (four) hours as needed. 05/20/16   Merrily Pew, MD  lisinopril (PRINIVIL,ZESTRIL) 10 MG tablet Take 10 mg by mouth daily.     Historical Provider, MD  loperamide (IMODIUM) 2 MG capsule Take 2 mg by mouth 4 (four) times daily as needed.    Historical Provider, MD  methadone (DOLOPHINE) 5 MG tablet Take 5 mg by mouth 2 (two) times daily.    Historical Provider, MD  omeprazole (PRILOSEC) 20 MG capsule Take 20 mg by mouth daily.    Historical Provider, MD  oxyCODONE-acetaminophen (PERCOCET/ROXICET) 5-325 MG per tablet Take 1 tablet by mouth every 6 (six) hours as needed for pain. 11/09/12   Dalia Heading, PA-C    Family History No family history on file.  Social History Social History  Substance Use Topics  . Smoking status: Former Research scientist (life sciences)  . Smokeless tobacco: Never Used  . Alcohol use No     Allergies   Review of patient's allergies indicates no known allergies.   Review of Systems Review of Systems  Constitutional: Positive for fatigue. Negative for unexpected weight change.       Night sweats x 5 days  Respiratory: Negative for cough and shortness of breath.   Gastrointestinal: Positive for abdominal pain (2/2 metastatic colon cancer) and nausea. Negative for vomiting.  Genitourinary: Negative for difficulty urinating.  Musculoskeletal: Negative for neck pain.       Left hip pain sometimes radiates to left leg  All other systems reviewed and are negative.  Physical Exam Updated Vital Signs BP 145/79 (BP Location: Right Arm)   Pulse (!) 52   Temp 98.5 F (36.9 C) (Oral)   Resp 16   Ht 5\' 9"  (1.753 m)   Wt 169 lb (76.7 kg)   SpO2 100%   BMI 24.96 kg/m   Physical Exam  Constitutional: He appears well-developed and well-nourished.  HENT:  Head: Normocephalic and atraumatic.  Eyes: Conjunctivae are normal.  Neck: Neck supple.  Cardiovascular: Normal rate and regular rhythm.   No murmur heard. Pulmonary/Chest: Effort normal and breath sounds normal. No respiratory distress.  Abdominal: Soft. There is no tenderness.  Musculoskeletal: He exhibits no edema.  Neurological: He is alert.    Skin: Skin is warm and dry.  Psychiatric: He has a normal mood and affect.  Nursing note and vitals reviewed.    ED Treatments / Results  Labs (all labs ordered are listed, but only abnormal results are displayed) Labs Reviewed  CBC WITH DIFFERENTIAL/PLATELET - Abnormal; Notable for the following:       Result Value   Hemoglobin 10.9 (*)    HCT 33.9 (*)    MCH 25.5 (*)    RDW 16.9 (*)    All other components within normal limits  COMPREHENSIVE METABOLIC PANEL - Abnormal; Notable for the following:    Glucose, Bld 108 (*)    Albumin 2.9 (*)    AST 13 (*)    ALT 5 (*)    All other components within normal limits  URINALYSIS, ROUTINE W REFLEX MICROSCOPIC (NOT AT [facility])    EKG  EKG Interpretation None       Radiology Ct Pelvis Wo Contrast  Result Date: 05/20/2016 CLINICAL DATA:  LEFT hip pain.  Abnormal radiograph EXAM: CT PELVIS WITHOUT CONTRAST TECHNIQUE: Multidetector CT imaging of the pelvis was performed following the standard protocol without intravenous contrast. COMPARISON:  Radiograph 05/20/2016, CT 04/14/2014 FINDINGS: Urinary Tract:  Unremarkable Bowel: Residual barium contrast within the excluded rectosigmoid colon. Several diverticula of the sigmoid colon. No acute inflammation. Vascular/Lymphatic: abdominal aorta is calcified. No retroperitoneal adenopathy. Reproductive:  Prostate mildly enlarged at 15 mm. Other:  LEFT lower quadrant colostomy Musculoskeletal: There is a permeative lesion involving the entirety of the LEFT iliac crests with a robust periosteal reaction consistent with aggressive osseous lesion. There is expansion into adjacent iliacus muscle measuring 3 cm in thickness compared to the normal 1.3 cm thick iliacus muscle on the RIGHT. IMPRESSION: 1. Aggressive lesion in the LEFT iliac bone with robust periosteal reaction is most consistent with neoplasm. Favor metastatic lesion over a primary bone lesion. 2. Inflammation / extension to the adjacent  musculature. Electronically Signed   By: Suzy Bouchard M.D.   On: 05/20/2016 12:59   Dg Hip Unilat W Or Wo Pelvis 2-3 Views Left  Result Date: 05/20/2016 CLINICAL DATA:  Left hip pain for several days. No known injury. History of colon cancer. EXAM: DG HIP (WITH OR WITHOUT PELVIS) 2-3V LEFT COMPARISON:  04/14/2014 CT FINDINGS: There is no evidence of hip fracture, subluxation or dislocation. Mild degenerative changes in the hips noted. There is increased irregular density overlying the left iliac crest. This may be related to this patient's colostomy, but difficult to exclude a bony lesion on this study. No other focal bony abnormalities are noted. IMPRESSION: No evidence of acute hip abnormality. Irregular increased density overlying the left iliac crest. Although this may be related to this patient's colostomy, a focal bony lesion is difficult to exclude. Consider dedicated  and oblique views as indicated. Electronically Signed   By: Margarette Canada M.D.   On: 05/20/2016 11:52    Procedures Procedures (including critical care time)  Medications Ordered in ED Medications  ketorolac (TORADOL) 30 MG/ML injection 9.9 mg (9.9 mg Intravenous Given 05/20/16 1130)     Initial Impression / Assessment and Plan / ED Course  I have reviewed the triage vital signs and the nursing notes.  Pertinent labs & imaging results that were available during my care of the patient were reviewed by me and considered in my medical decision making (see chart for details).  Clinical Course   Suspect OA, but has stage IV colon cancer, will eval for metastatic to bone.   CT w/ e/o metastatic cancer, likely cause of pain, will increase amount of hydrocodone at home will dw his oncologist tomorrow.   Final Clinical Impressions(s) / ED Diagnoses   Final diagnoses:  Metastatic cancer to bone [facility])    New Prescriptions Discharge Medication List as of 05/20/2016  3:47 PM       Merrily Pew, MD 05/20/16 2008

## 2016-05-20 NOTE — ED Notes (Signed)
Patient transported to X-ray 

## 2016-05-24 DIAGNOSIS — Z79899 Other long term (current) drug therapy: Secondary | ICD-10-CM | POA: Diagnosis not present

## 2016-05-24 DIAGNOSIS — M25551 Pain in right hip: Secondary | ICD-10-CM | POA: Diagnosis not present

## 2016-05-24 DIAGNOSIS — G893 Neoplasm related pain (acute) (chronic): Secondary | ICD-10-CM | POA: Diagnosis not present

## 2016-05-24 DIAGNOSIS — C189 Malignant neoplasm of colon, unspecified: Secondary | ICD-10-CM | POA: Diagnosis not present

## 2016-05-24 DIAGNOSIS — Z9221 Personal history of antineoplastic chemotherapy: Secondary | ICD-10-CM | POA: Diagnosis not present

## 2016-06-08 DIAGNOSIS — Z9221 Personal history of antineoplastic chemotherapy: Secondary | ICD-10-CM | POA: Diagnosis not present

## 2016-06-08 DIAGNOSIS — Z7984 Long term (current) use of oral hypoglycemic drugs: Secondary | ICD-10-CM | POA: Diagnosis not present

## 2016-06-08 DIAGNOSIS — Z510 Encounter for antineoplastic radiation therapy: Secondary | ICD-10-CM | POA: Diagnosis not present

## 2016-06-08 DIAGNOSIS — M79652 Pain in left thigh: Secondary | ICD-10-CM | POA: Diagnosis not present

## 2016-06-08 DIAGNOSIS — M4802 Spinal stenosis, cervical region: Secondary | ICD-10-CM | POA: Diagnosis not present

## 2016-06-08 DIAGNOSIS — Z79899 Other long term (current) drug therapy: Secondary | ICD-10-CM | POA: Diagnosis not present

## 2016-06-08 DIAGNOSIS — Z882 Allergy status to sulfonamides status: Secondary | ICD-10-CM | POA: Diagnosis not present

## 2016-06-08 DIAGNOSIS — C7951 Secondary malignant neoplasm of bone: Secondary | ICD-10-CM | POA: Diagnosis not present

## 2016-06-08 DIAGNOSIS — E119 Type 2 diabetes mellitus without complications: Secondary | ICD-10-CM | POA: Diagnosis not present

## 2016-06-08 DIAGNOSIS — I10 Essential (primary) hypertension: Secondary | ICD-10-CM | POA: Diagnosis not present

## 2016-06-08 DIAGNOSIS — Z87891 Personal history of nicotine dependence: Secondary | ICD-10-CM | POA: Diagnosis not present

## 2016-06-08 DIAGNOSIS — C189 Malignant neoplasm of colon, unspecified: Secondary | ICD-10-CM | POA: Diagnosis not present

## 2016-06-11 DIAGNOSIS — I10 Essential (primary) hypertension: Secondary | ICD-10-CM | POA: Diagnosis not present

## 2016-06-11 DIAGNOSIS — C7951 Secondary malignant neoplasm of bone: Secondary | ICD-10-CM | POA: Diagnosis not present

## 2016-06-11 DIAGNOSIS — C189 Malignant neoplasm of colon, unspecified: Secondary | ICD-10-CM | POA: Diagnosis not present

## 2016-06-11 DIAGNOSIS — Z87891 Personal history of nicotine dependence: Secondary | ICD-10-CM | POA: Diagnosis not present

## 2016-06-11 DIAGNOSIS — Z510 Encounter for antineoplastic radiation therapy: Secondary | ICD-10-CM | POA: Diagnosis not present

## 2016-06-11 DIAGNOSIS — E119 Type 2 diabetes mellitus without complications: Secondary | ICD-10-CM | POA: Diagnosis not present

## 2016-06-11 DIAGNOSIS — M4802 Spinal stenosis, cervical region: Secondary | ICD-10-CM | POA: Diagnosis not present

## 2016-06-11 DIAGNOSIS — Z9221 Personal history of antineoplastic chemotherapy: Secondary | ICD-10-CM | POA: Diagnosis not present

## 2016-06-11 DIAGNOSIS — Z7984 Long term (current) use of oral hypoglycemic drugs: Secondary | ICD-10-CM | POA: Diagnosis not present

## 2016-06-11 DIAGNOSIS — Z882 Allergy status to sulfonamides status: Secondary | ICD-10-CM | POA: Diagnosis not present

## 2016-06-11 DIAGNOSIS — Z79899 Other long term (current) drug therapy: Secondary | ICD-10-CM | POA: Diagnosis not present

## 2016-06-12 DIAGNOSIS — H401132 Primary open-angle glaucoma, bilateral, moderate stage: Secondary | ICD-10-CM | POA: Diagnosis not present

## 2016-06-12 DIAGNOSIS — H25013 Cortical age-related cataract, bilateral: Secondary | ICD-10-CM | POA: Diagnosis not present

## 2016-06-12 DIAGNOSIS — H5203 Hypermetropia, bilateral: Secondary | ICD-10-CM | POA: Diagnosis not present

## 2016-06-12 DIAGNOSIS — H2513 Age-related nuclear cataract, bilateral: Secondary | ICD-10-CM | POA: Diagnosis not present

## 2016-06-12 DIAGNOSIS — H52203 Unspecified astigmatism, bilateral: Secondary | ICD-10-CM | POA: Diagnosis not present

## 2016-06-13 DIAGNOSIS — Z7984 Long term (current) use of oral hypoglycemic drugs: Secondary | ICD-10-CM | POA: Diagnosis not present

## 2016-06-13 DIAGNOSIS — Z9221 Personal history of antineoplastic chemotherapy: Secondary | ICD-10-CM | POA: Diagnosis not present

## 2016-06-13 DIAGNOSIS — C189 Malignant neoplasm of colon, unspecified: Secondary | ICD-10-CM | POA: Diagnosis not present

## 2016-06-13 DIAGNOSIS — E119 Type 2 diabetes mellitus without complications: Secondary | ICD-10-CM | POA: Diagnosis not present

## 2016-06-13 DIAGNOSIS — Z87891 Personal history of nicotine dependence: Secondary | ICD-10-CM | POA: Diagnosis not present

## 2016-06-13 DIAGNOSIS — Z882 Allergy status to sulfonamides status: Secondary | ICD-10-CM | POA: Diagnosis not present

## 2016-06-13 DIAGNOSIS — I10 Essential (primary) hypertension: Secondary | ICD-10-CM | POA: Diagnosis not present

## 2016-06-13 DIAGNOSIS — M4802 Spinal stenosis, cervical region: Secondary | ICD-10-CM | POA: Diagnosis not present

## 2016-06-13 DIAGNOSIS — Z510 Encounter for antineoplastic radiation therapy: Secondary | ICD-10-CM | POA: Diagnosis not present

## 2016-06-13 DIAGNOSIS — C7951 Secondary malignant neoplasm of bone: Secondary | ICD-10-CM | POA: Diagnosis not present

## 2016-06-13 DIAGNOSIS — Z79899 Other long term (current) drug therapy: Secondary | ICD-10-CM | POA: Diagnosis not present

## 2016-06-14 DIAGNOSIS — C189 Malignant neoplasm of colon, unspecified: Secondary | ICD-10-CM | POA: Diagnosis not present

## 2016-06-14 DIAGNOSIS — C786 Secondary malignant neoplasm of retroperitoneum and peritoneum: Secondary | ICD-10-CM | POA: Diagnosis not present

## 2016-06-14 DIAGNOSIS — G893 Neoplasm related pain (acute) (chronic): Secondary | ICD-10-CM | POA: Diagnosis not present

## 2016-06-14 DIAGNOSIS — C7951 Secondary malignant neoplasm of bone: Secondary | ICD-10-CM | POA: Diagnosis not present

## 2016-06-15 DIAGNOSIS — Z79899 Other long term (current) drug therapy: Secondary | ICD-10-CM | POA: Diagnosis not present

## 2016-06-15 DIAGNOSIS — E119 Type 2 diabetes mellitus without complications: Secondary | ICD-10-CM | POA: Diagnosis not present

## 2016-06-15 DIAGNOSIS — Z87891 Personal history of nicotine dependence: Secondary | ICD-10-CM | POA: Diagnosis not present

## 2016-06-15 DIAGNOSIS — C189 Malignant neoplasm of colon, unspecified: Secondary | ICD-10-CM | POA: Diagnosis not present

## 2016-06-15 DIAGNOSIS — M4802 Spinal stenosis, cervical region: Secondary | ICD-10-CM | POA: Diagnosis not present

## 2016-06-15 DIAGNOSIS — I10 Essential (primary) hypertension: Secondary | ICD-10-CM | POA: Diagnosis not present

## 2016-06-15 DIAGNOSIS — Z7984 Long term (current) use of oral hypoglycemic drugs: Secondary | ICD-10-CM | POA: Diagnosis not present

## 2016-06-15 DIAGNOSIS — Z882 Allergy status to sulfonamides status: Secondary | ICD-10-CM | POA: Diagnosis not present

## 2016-06-15 DIAGNOSIS — Z510 Encounter for antineoplastic radiation therapy: Secondary | ICD-10-CM | POA: Diagnosis not present

## 2016-06-15 DIAGNOSIS — C7951 Secondary malignant neoplasm of bone: Secondary | ICD-10-CM | POA: Diagnosis not present

## 2016-06-15 DIAGNOSIS — Z9221 Personal history of antineoplastic chemotherapy: Secondary | ICD-10-CM | POA: Diagnosis not present

## 2016-06-18 DIAGNOSIS — I10 Essential (primary) hypertension: Secondary | ICD-10-CM | POA: Diagnosis not present

## 2016-06-18 DIAGNOSIS — M4802 Spinal stenosis, cervical region: Secondary | ICD-10-CM | POA: Diagnosis not present

## 2016-06-18 DIAGNOSIS — Z510 Encounter for antineoplastic radiation therapy: Secondary | ICD-10-CM | POA: Diagnosis not present

## 2016-06-18 DIAGNOSIS — Z9221 Personal history of antineoplastic chemotherapy: Secondary | ICD-10-CM | POA: Diagnosis not present

## 2016-06-18 DIAGNOSIS — Z882 Allergy status to sulfonamides status: Secondary | ICD-10-CM | POA: Diagnosis not present

## 2016-06-18 DIAGNOSIS — C7951 Secondary malignant neoplasm of bone: Secondary | ICD-10-CM | POA: Diagnosis not present

## 2016-06-18 DIAGNOSIS — Z7984 Long term (current) use of oral hypoglycemic drugs: Secondary | ICD-10-CM | POA: Diagnosis not present

## 2016-06-18 DIAGNOSIS — Z79899 Other long term (current) drug therapy: Secondary | ICD-10-CM | POA: Diagnosis not present

## 2016-06-18 DIAGNOSIS — Z87891 Personal history of nicotine dependence: Secondary | ICD-10-CM | POA: Diagnosis not present

## 2016-06-18 DIAGNOSIS — C189 Malignant neoplasm of colon, unspecified: Secondary | ICD-10-CM | POA: Diagnosis not present

## 2016-06-18 DIAGNOSIS — E119 Type 2 diabetes mellitus without complications: Secondary | ICD-10-CM | POA: Diagnosis not present

## 2016-06-19 DIAGNOSIS — I10 Essential (primary) hypertension: Secondary | ICD-10-CM | POA: Diagnosis not present

## 2016-06-19 DIAGNOSIS — Z510 Encounter for antineoplastic radiation therapy: Secondary | ICD-10-CM | POA: Diagnosis not present

## 2016-06-19 DIAGNOSIS — C189 Malignant neoplasm of colon, unspecified: Secondary | ICD-10-CM | POA: Diagnosis not present

## 2016-06-19 DIAGNOSIS — Z79899 Other long term (current) drug therapy: Secondary | ICD-10-CM | POA: Diagnosis not present

## 2016-06-19 DIAGNOSIS — C7951 Secondary malignant neoplasm of bone: Secondary | ICD-10-CM | POA: Diagnosis not present

## 2016-06-19 DIAGNOSIS — Z87891 Personal history of nicotine dependence: Secondary | ICD-10-CM | POA: Diagnosis not present

## 2016-06-19 DIAGNOSIS — Z9221 Personal history of antineoplastic chemotherapy: Secondary | ICD-10-CM | POA: Diagnosis not present

## 2016-06-19 DIAGNOSIS — E119 Type 2 diabetes mellitus without complications: Secondary | ICD-10-CM | POA: Diagnosis not present

## 2016-06-19 DIAGNOSIS — M4802 Spinal stenosis, cervical region: Secondary | ICD-10-CM | POA: Diagnosis not present

## 2016-06-19 DIAGNOSIS — Z7984 Long term (current) use of oral hypoglycemic drugs: Secondary | ICD-10-CM | POA: Diagnosis not present

## 2016-06-19 DIAGNOSIS — Z882 Allergy status to sulfonamides status: Secondary | ICD-10-CM | POA: Diagnosis not present

## 2016-06-20 DIAGNOSIS — Z79899 Other long term (current) drug therapy: Secondary | ICD-10-CM | POA: Diagnosis not present

## 2016-06-20 DIAGNOSIS — Z9221 Personal history of antineoplastic chemotherapy: Secondary | ICD-10-CM | POA: Diagnosis not present

## 2016-06-20 DIAGNOSIS — Z87891 Personal history of nicotine dependence: Secondary | ICD-10-CM | POA: Diagnosis not present

## 2016-06-20 DIAGNOSIS — I10 Essential (primary) hypertension: Secondary | ICD-10-CM | POA: Diagnosis not present

## 2016-06-20 DIAGNOSIS — Z882 Allergy status to sulfonamides status: Secondary | ICD-10-CM | POA: Diagnosis not present

## 2016-06-20 DIAGNOSIS — Z510 Encounter for antineoplastic radiation therapy: Secondary | ICD-10-CM | POA: Diagnosis not present

## 2016-06-20 DIAGNOSIS — Z7984 Long term (current) use of oral hypoglycemic drugs: Secondary | ICD-10-CM | POA: Diagnosis not present

## 2016-06-20 DIAGNOSIS — C7951 Secondary malignant neoplasm of bone: Secondary | ICD-10-CM | POA: Diagnosis not present

## 2016-06-20 DIAGNOSIS — E119 Type 2 diabetes mellitus without complications: Secondary | ICD-10-CM | POA: Diagnosis not present

## 2016-06-20 DIAGNOSIS — M4802 Spinal stenosis, cervical region: Secondary | ICD-10-CM | POA: Diagnosis not present

## 2016-06-20 DIAGNOSIS — C189 Malignant neoplasm of colon, unspecified: Secondary | ICD-10-CM | POA: Diagnosis not present

## 2016-06-21 DIAGNOSIS — M4802 Spinal stenosis, cervical region: Secondary | ICD-10-CM | POA: Diagnosis not present

## 2016-06-21 DIAGNOSIS — Z9221 Personal history of antineoplastic chemotherapy: Secondary | ICD-10-CM | POA: Diagnosis not present

## 2016-06-21 DIAGNOSIS — Z87891 Personal history of nicotine dependence: Secondary | ICD-10-CM | POA: Diagnosis not present

## 2016-06-21 DIAGNOSIS — C7951 Secondary malignant neoplasm of bone: Secondary | ICD-10-CM | POA: Diagnosis not present

## 2016-06-21 DIAGNOSIS — Z7984 Long term (current) use of oral hypoglycemic drugs: Secondary | ICD-10-CM | POA: Diagnosis not present

## 2016-06-21 DIAGNOSIS — C189 Malignant neoplasm of colon, unspecified: Secondary | ICD-10-CM | POA: Diagnosis not present

## 2016-06-21 DIAGNOSIS — Z882 Allergy status to sulfonamides status: Secondary | ICD-10-CM | POA: Diagnosis not present

## 2016-06-21 DIAGNOSIS — Z510 Encounter for antineoplastic radiation therapy: Secondary | ICD-10-CM | POA: Diagnosis not present

## 2016-06-21 DIAGNOSIS — Z79899 Other long term (current) drug therapy: Secondary | ICD-10-CM | POA: Diagnosis not present

## 2016-06-21 DIAGNOSIS — E119 Type 2 diabetes mellitus without complications: Secondary | ICD-10-CM | POA: Diagnosis not present

## 2016-06-21 DIAGNOSIS — I10 Essential (primary) hypertension: Secondary | ICD-10-CM | POA: Diagnosis not present

## 2016-06-29 ENCOUNTER — Encounter (HOSPITAL_BASED_OUTPATIENT_CLINIC_OR_DEPARTMENT_OTHER): Payer: Self-pay | Admitting: *Deleted

## 2016-06-29 ENCOUNTER — Emergency Department (HOSPITAL_BASED_OUTPATIENT_CLINIC_OR_DEPARTMENT_OTHER): Payer: Medicare Other

## 2016-06-29 ENCOUNTER — Emergency Department (HOSPITAL_BASED_OUTPATIENT_CLINIC_OR_DEPARTMENT_OTHER)
Admission: EM | Admit: 2016-06-29 | Discharge: 2016-06-30 | Disposition: A | Discharge status: Home / Self Care | Payer: Medicare Other | Attending: Emergency Medicine | Admitting: Emergency Medicine

## 2016-06-29 DIAGNOSIS — Z79891 Long term (current) use of opiate analgesic: Secondary | ICD-10-CM | POA: Insufficient documentation

## 2016-06-29 DIAGNOSIS — Z87891 Personal history of nicotine dependence: Secondary | ICD-10-CM | POA: Insufficient documentation

## 2016-06-29 DIAGNOSIS — Z79899 Other long term (current) drug therapy: Secondary | ICD-10-CM | POA: Insufficient documentation

## 2016-06-29 DIAGNOSIS — Z85038 Personal history of other malignant neoplasm of large intestine: Secondary | ICD-10-CM | POA: Diagnosis not present

## 2016-06-29 DIAGNOSIS — T473X5A Adverse effect of saline and osmotic laxatives, initial encounter: Secondary | ICD-10-CM | POA: Insufficient documentation

## 2016-06-29 DIAGNOSIS — K5903 Drug induced constipation: Secondary | ICD-10-CM | POA: Diagnosis not present

## 2016-06-29 DIAGNOSIS — I10 Essential (primary) hypertension: Secondary | ICD-10-CM | POA: Diagnosis not present

## 2016-06-29 DIAGNOSIS — E119 Type 2 diabetes mellitus without complications: Secondary | ICD-10-CM | POA: Diagnosis not present

## 2016-06-29 DIAGNOSIS — R1909 Other intra-abdominal and pelvic swelling, mass and lump: Secondary | ICD-10-CM | POA: Diagnosis not present

## 2016-06-29 LAB — CBC WITH DIFFERENTIAL/PLATELET
Basophils Absolute: 0 10*3/uL (ref 0.0–0.1)
Basophils Relative: 0 %
EOS PCT: 1 %
Eosinophils Absolute: 0.1 10*3/uL (ref 0.0–0.7)
HCT: 35.4 % — ABNORMAL LOW (ref 39.0–52.0)
HEMOGLOBIN: 11.3 g/dL — AB (ref 13.0–17.0)
LYMPHS ABS: 0.7 10*3/uL (ref 0.7–4.0)
LYMPHS PCT: 13 %
MCH: 25.4 pg — AB (ref 26.0–34.0)
MCHC: 31.9 g/dL (ref 30.0–36.0)
MCV: 79.6 fL (ref 78.0–100.0)
MONOS PCT: 14 %
Monocytes Absolute: 0.7 10*3/uL (ref 0.1–1.0)
Neutro Abs: 3.7 10*3/uL (ref 1.7–7.7)
Neutrophils Relative %: 72 %
PLATELETS: 189 10*3/uL (ref 150–400)
RBC: 4.45 MIL/uL (ref 4.22–5.81)
RDW: 16.7 % — ABNORMAL HIGH (ref 11.5–15.5)
WBC: 5.1 10*3/uL (ref 4.0–10.5)

## 2016-06-29 LAB — COMPREHENSIVE METABOLIC PANEL
ALBUMIN: 3.4 g/dL — AB (ref 3.5–5.0)
ALK PHOS: 65 U/L (ref 38–126)
ALT: 6 U/L — AB (ref 17–63)
AST: 20 U/L (ref 15–41)
Anion gap: 7 (ref 5–15)
BUN: 12 mg/dL (ref 6–20)
CALCIUM: 9 mg/dL (ref 8.9–10.3)
CHLORIDE: 103 mmol/L (ref 101–111)
CO2: 28 mmol/L (ref 22–32)
CREATININE: 0.69 mg/dL (ref 0.61–1.24)
GFR calc non Af Amer: >60 mL/min (ref >60)
GLUCOSE: 96 mg/dL (ref 65–99)
Potassium: 4 mmol/L (ref 3.5–5.1)
SODIUM: 138 mmol/L (ref 135–145)
Total Bilirubin: 0.8 mg/dL (ref 0.3–1.2)
Total Protein: 6.9 g/dL (ref 6.5–8.1)

## 2016-06-29 LAB — LIPASE, BLOOD: Lipase: 15 U/L (ref 11–51)

## 2016-06-29 MED ORDER — OXYCODONE-ACETAMINOPHEN 5-325 MG PO TABS
2.0000 | ORAL_TABLET | Freq: Once | ORAL | Status: AC
  Administered 2016-06-30: 2 via ORAL
  Filled 2016-06-29: qty 2

## 2016-06-29 MED ORDER — MAGNESIUM CITRATE PO SOLN
1.0000 | Freq: Once | ORAL | Status: AC
  Administered 2016-06-30: 1 via ORAL
  Filled 2016-06-29: qty 296

## 2016-06-29 MED ORDER — IOPAMIDOL (ISOVUE-300) INJECTION 61%
100.0000 mL | Freq: Once | INTRAVENOUS | Status: AC | PRN
  Administered 2016-06-29: 100 mL via INTRAVENOUS

## 2016-06-29 NOTE — ED Triage Notes (Signed)
C/o  Constipation. Has ostomy bag to left side. Pt has colon ca and just finished radiation last Friday. C/o low back pain.

## 2016-06-29 NOTE — ED Provider Notes (Signed)
[facility] Provider Note   CSN: [id number] Arrival date & time: 06/29/16  1835 By signing my name below, I, Dyke Brackett, attest that this documentation has been prepared under the direction and in the presence of Sharlett Iles, MD . Electronically Signed: Dyke Brackett, Scribe. 06/29/2016. 7:36 PM.   History   Chief Complaint Chief Complaint  Patient presents with  . Constipation   HPI Dakota Malone is a 75 y.o. male with hx of cancer, DM, and HTN who presents to the Emergency Department complaining of constipation for three days. Per pt, he last emptied his bag three days ago and has not had any output since. He states he is able to eat, but hasn't been eating as much due to the constipation. He has taken Miralax with no relief. Pt has never experienced this before. Pt has colon cancer and bone metastasis in his buttock; he finished radiation one week ago. He complains of pain to his lower back and buttock, but states this is normal for him. Pt is currently taking fentanyl and hydrocodone for the pain. Pt has no other complaints at this time. He denies any abdominal pain, nausea, dysuria, hematuria, fever, or vomiting. Pt has been on narcotics for a long time but has never had problems w/ constipation while taking them.  The history is provided by the patient. No language interpreter was used.   Past Medical History:  Diagnosis Date  . Colon cancer ([place])   . Diabetes mellitus    Patient Active Problem List   Diagnosis Date Noted  . LUMBAR SPRAIN AND STRAIN 05/22/2010  . DIABETES MELLITUS, TYPE I 05/15/2010  . HYPERTENSION 05/15/2010  . RIB PAIN, LEFT SIDED 05/15/2010  . CONTUSION, LEFT CHEST WALL 05/15/2010   Past Surgical History:  Procedure Laterality Date  . COLECTOMY    . COLECTOMY    . COLON SURGERY    . COLOSTOMY      Home Medications    Prior to Admission medications   Medication Sig Start Date End Date Taking? Authorizing Provider    citalopram (CELEXA) 20 MG tablet Take 20 mg by mouth daily.   Yes Historical Provider, MD  fentaNYL (DURAGESIC - DOSED MCG/HR) 50 MCG/HR Place 1 patch onto the skin every 3 (three) days.   Yes Historical Provider, MD  gabapentin (NEURONTIN) 300 MG capsule Take 300 mg by mouth 3 (three) times daily.   Yes Historical Provider, MD  glipiZIDE (GLUCOTROL) 5 MG tablet Take 5 mg by mouth 2 (two) times daily before a meal.   Yes Historical Provider, MD  HYDROcodone-acetaminophen (NORCO/VICODIN) 5-325 MG tablet Take 1 tablet by mouth every 4 (four) hours as needed. 05/20/16  Yes Merrily Pew, MD  lisinopril (PRINIVIL,ZESTRIL) 10 MG tablet Take 10 mg by mouth daily.   Yes Historical Provider, MD  loperamide (IMODIUM) 2 MG capsule Take 2 mg by mouth 4 (four) times daily as needed.   Yes Historical Provider, MD  methadone (DOLOPHINE) 5 MG tablet Take 5 mg by mouth 2 (two) times daily.   Yes Historical Provider, MD  omeprazole (PRILOSEC) 20 MG capsule Take 20 mg by mouth daily.   Yes Historical Provider, MD    Family History No family history on file.  Social History Social History  Substance Use Topics  . Smoking status: Former Research scientist (life sciences)  . Smokeless tobacco: Never Used  . Alcohol use No   Allergies   Sulfa antibiotics  Review of Systems Review of Systems 10 systems reviewed and all  are negative for acute change except as noted in the HPI.   Physical Exam Updated Vital Signs BP 152/73 (BP Location: Left Arm)   Pulse (!) 51   Temp 98.2 F (36.8 C) (Oral)   Resp 18   Ht 5\' 9"  (1.753 m)   Wt 170 lb (77.1 kg)   SpO2 96%   BMI 25.10 kg/m   Physical Exam  Constitutional: He is oriented to person, place, and time. He appears well-developed and well-nourished. No distress.  HENT:  Head: Normocephalic and atraumatic.  Moist mucous membranes  Eyes: Conjunctivae are normal. Pupils are equal, round, and reactive to light.  Neck: Neck supple.  Cardiovascular: Normal rate, regular rhythm and  normal heart sounds.   No murmur heard. Pulmonary/Chest: Effort normal and breath sounds normal.  Abdominal: Soft. Bowel sounds are normal. There is no rebound and no guarding.  Ostomy in lower abdomen with no stool in bag; mild TTP LLQ; mild distension  Musculoskeletal: He exhibits no edema.  Neurological: He is alert and oriented to person, place, and time.  Fluent speech  Skin: Skin is warm and dry. No rash noted.  Psychiatric: He has a normal mood and affect. Judgment normal.  Nursing note and vitals reviewed.  ED Treatments / Results  DIAGNOSTIC STUDIES:  Oxygen Saturation is 97% on RA, normal by my interpretation.    COORDINATION OF CARE:  7:34 PM Discussed treatment plan with pt at bedside and pt agreed to plan.   Labs (all labs ordered are listed, but only abnormal results are displayed) Labs Reviewed  COMPREHENSIVE METABOLIC PANEL - Abnormal; Notable for the following:       Result Value   Albumin 3.4 (*)    ALT 6 (*)    All other components within normal limits  CBC WITH DIFFERENTIAL/PLATELET - Abnormal; Notable for the following:    Hemoglobin 11.3 (*)    HCT 35.4 (*)    MCH 25.4 (*)    RDW 16.7 (*)    All other components within normal limits  LIPASE, BLOOD    EKG  EKG Interpretation None       Radiology Ct Abdomen Pelvis W Contrast  Result Date: 06/29/2016 CLINICAL DATA:  Initial evaluation for left lower quadrant tenderness, colostomy due to colon cancer. Constipation. EXAM: CT ABDOMEN AND PELVIS WITH CONTRAST TECHNIQUE: Multidetector CT imaging of the abdomen and pelvis was performed using the standard protocol following bolus administration of intravenous contrast. CONTRAST:  180mL ISOVUE-300 IOPAMIDOL (ISOVUE-300) INJECTION 61% COMPARISON:  Prior CT from 04/14/2014. FINDINGS: Lower chest: Mild bibasilar atelectasis/scarring noted. Visualized lung bases are otherwise clear. Small pericardial effusion. Effusion measures simple fluid density, and is  likely transudative. Hepatobiliary: 1 cm hypodensity within the left hepatic lobe is grossly similar to previous, consistent with a benign lesion. Additional faint 7 mm hypodensity within the central aspect the liver too small the characterize (series 2, image 14). Liver otherwise unremarkable. Gallbladder within normal limits. No biliary dilatation. Pancreas: Approximately 1 cm cystic lesion at the tail of the pancreas is relatively similar to previous. Pancreas otherwise unremarkable without acute inflammatory changes. Spleen: Spleen within normal limits. Adrenals/Urinary Tract: Adrenal glands are normal. Kidneys equal in size with symmetric enhancement. No nephrolithiasis. No focal enhancing renal mass. Mild left-sided hydronephrosis, suspected to be related to the left lower quadrant process. No obstructive stone identified. No right-sided hydronephrosis or hydroureter. Bladder within normal limits. Stomach/Bowel: Stomach within normal limits. Small bowel of normal caliber without evidence for obstruction. Right inguinal  hernia containing the appendix is present. Appendix itself but of normal caliber without associated inflammatory changes to suggest acute appendicitis. Left lower quadrant colostomy in place. No complicating features about the colostomy itself. Large amount of retained stool within the rectal vault. Question mild wall thickening with hazy inflammatory stranding about the residual sigmoid colon, which may reflect mild acute diverticulitis (series 5, image 42) Vascular/Lymphatic: Mild aorto bi-iliac atherosclerotic disease. No aneurysm. No pathologically enlarged lymph nodes identified within the abdomen and pelvis. The Reproductive: Prostate mildly prominent measuring 5.5 cm. Other: No free intraperitoneal air.  No free fluid. Musculoskeletal: There is abnormal wall thickening with sclerosis and periosteal reaction involving the left iliac wing, new from prior. Lesion does not cross the left SI  joint. Associated soft tissue mass at both the medial and lateral aspects of the iliac wing involving the iliacus and gluteus musculature lesion abuts the left psoas muscle medially which may be involved as well. Lesion measures approximately in 9.1 x 7.7 x 10.0 cm in size (series 2, image 56). Additional soft tissue implants along the left pericolic gutter superiorly (series 2, image 44). Asymmetric thickening along the left anterolateral peritoneum also concerning for direct extension of disease. Rectus abdominus musculature along the left abdominal wall is abnormally thickened and edematous (series 2, image 46). Given the history of prior radiation therapy and appearance of this lesion, a radiation induced osteosarcoma would be the primary differential consideration. Metastatic disease could also be considered. No other definite osseous lesions identified. IMPRESSION: 1. Abnormal mass centered at the left iliac wing with associated spiculated periosteal reaction as above. 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 Metastatic disease could also be considered. 2. Mild wall thickening with hazy stranding about the residual sigmoid colon within the left lower quadrant, suspected to be related to the adjacent process in the left iliac wing and/or post radiation changes. Acute diverticulitis could also have this appearance. 3. Large amount of impacted stool within the rectal vault. 4. Left lower quadrant colostomy without complicating features. 5. Mild left-sided hydronephrosis, also likely related to the process in the left iliac wing. 6. Right inguinal hernia containing the appendix. No associated inflammation or evidence for acute appendicitis. 7. Stable 10 mm cystic lesion at the pancreatic tail. 8. Small pericardial fusion. Electronically Signed   By: Jeannine Boga M.D.   On: 06/29/2016 22:55    Procedures Procedures  (including critical care time)  Medications Ordered in ED Medications  magnesium citrate solution 1 Bottle (not administered)  oxyCODONE-acetaminophen (PERCOCET/ROXICET) 5-325 MG per tablet 2 tablet (not administered)  iopamidol (ISOVUE-300) 61 % injection 100 mL (100 mLs Intravenous Contrast Given 06/29/16 2202)     Initial Impression / Assessment and Plan / ED Course  I have reviewed the triage vital signs and the nursing notes.  Pertinent labs & imaging results that were available during my care of the patient were reviewed by me and considered in my medical decision making (see chart for details).  Clinical Course   Pt w/ colostomy from colon CA p/w 3 days of no output in ostomy bag, no response to miralax. He was comfortable with normal vital signs on exam. Ostomy bag with empty. Mild left lower quadrant tenderness. Obtained above lab work as well as CT scan to evaluate for acute process such as bowel obstruction.  Labwork unremarkable and shows stable anemia. CT shows known mass at left iliac wing for which patient is  receiving radiation. Patient had large amount of impacted stool in rectal vault on CT. He has been tolerating drinks here with no vomiting or problems.   He has not been taking daily medications to prevent constipation while on chronic opiates. Gave magnesium citrate in the ED and discussed Colace and MiraLAX use. Instructed on how to titrate MiraLAX. Reviewed return precautions including worsening abdominal pain, vomiting, or fever. Instructed to follow-up with PCP/oncologist regarding his symptoms. Patient voiced understanding and was discharged in satisfactory condition.  Final Clinical Impressions(s) / ED Diagnoses   Final diagnoses:  Drug-induced constipation   New Prescriptions New Prescriptions   No medications on file  I personally performed the services described in this documentation, which was scribed in my presence. The recorded information has been  reviewed and is accurate.    Sharlett Iles, MD 06/29/16 [phone]

## 2016-06-29 NOTE — Discharge Instructions (Signed)
Take colace 1 capsule daily. Take miralax 1 capful mixed in drink 1 to 3 times daily as needed for constipation. If no relief of constipation after magnesium citrate, mix 8 capfuls of miralax in a 32oz beverage and drink over 2 days. Return immediately if you have worsening abdominal pain, vomiting, or fever.

## 2016-06-29 NOTE — ED Notes (Signed)
Patient returns from CT

## 2016-07-16 DIAGNOSIS — Z923 Personal history of irradiation: Secondary | ICD-10-CM | POA: Diagnosis not present

## 2016-07-16 DIAGNOSIS — G893 Neoplasm related pain (acute) (chronic): Secondary | ICD-10-CM | POA: Diagnosis not present

## 2016-07-16 DIAGNOSIS — Z79899 Other long term (current) drug therapy: Secondary | ICD-10-CM | POA: Diagnosis not present

## 2016-07-16 DIAGNOSIS — C7951 Secondary malignant neoplasm of bone: Secondary | ICD-10-CM | POA: Diagnosis not present

## 2016-07-16 DIAGNOSIS — C189 Malignant neoplasm of colon, unspecified: Secondary | ICD-10-CM | POA: Diagnosis not present

## 2016-07-18 DIAGNOSIS — Z510 Encounter for antineoplastic radiation therapy: Secondary | ICD-10-CM | POA: Diagnosis not present

## 2016-07-18 DIAGNOSIS — M4802 Spinal stenosis, cervical region: Secondary | ICD-10-CM | POA: Diagnosis not present

## 2016-07-18 DIAGNOSIS — Z7984 Long term (current) use of oral hypoglycemic drugs: Secondary | ICD-10-CM | POA: Diagnosis not present

## 2016-07-18 DIAGNOSIS — Z882 Allergy status to sulfonamides status: Secondary | ICD-10-CM | POA: Diagnosis not present

## 2016-07-18 DIAGNOSIS — C189 Malignant neoplasm of colon, unspecified: Secondary | ICD-10-CM | POA: Diagnosis not present

## 2016-07-18 DIAGNOSIS — I10 Essential (primary) hypertension: Secondary | ICD-10-CM | POA: Diagnosis not present

## 2016-07-18 DIAGNOSIS — C7951 Secondary malignant neoplasm of bone: Secondary | ICD-10-CM | POA: Diagnosis not present

## 2016-07-18 DIAGNOSIS — Z87891 Personal history of nicotine dependence: Secondary | ICD-10-CM | POA: Diagnosis not present

## 2016-07-18 DIAGNOSIS — E119 Type 2 diabetes mellitus without complications: Secondary | ICD-10-CM | POA: Diagnosis not present

## 2016-07-18 DIAGNOSIS — Z9221 Personal history of antineoplastic chemotherapy: Secondary | ICD-10-CM | POA: Diagnosis not present

## 2016-07-18 DIAGNOSIS — Z79899 Other long term (current) drug therapy: Secondary | ICD-10-CM | POA: Diagnosis not present

## 2016-07-23 DIAGNOSIS — Z760 Encounter for issue of repeat prescription: Secondary | ICD-10-CM | POA: Diagnosis not present

## 2016-07-23 DIAGNOSIS — I10 Essential (primary) hypertension: Secondary | ICD-10-CM | POA: Diagnosis not present

## 2016-07-23 DIAGNOSIS — E119 Type 2 diabetes mellitus without complications: Secondary | ICD-10-CM | POA: Diagnosis not present

## 2016-09-06 DIAGNOSIS — J309 Allergic rhinitis, unspecified: Secondary | ICD-10-CM | POA: Diagnosis not present

## 2016-09-06 DIAGNOSIS — Z85038 Personal history of other malignant neoplasm of large intestine: Secondary | ICD-10-CM | POA: Diagnosis not present

## 2016-09-06 DIAGNOSIS — I10 Essential (primary) hypertension: Secondary | ICD-10-CM | POA: Diagnosis not present

## 2016-09-06 DIAGNOSIS — R21 Rash and other nonspecific skin eruption: Secondary | ICD-10-CM | POA: Diagnosis not present

## 2016-09-06 DIAGNOSIS — C7951 Secondary malignant neoplasm of bone: Secondary | ICD-10-CM | POA: Diagnosis not present

## 2016-09-06 DIAGNOSIS — C189 Malignant neoplasm of colon, unspecified: Secondary | ICD-10-CM | POA: Diagnosis not present

## 2016-09-06 DIAGNOSIS — K219 Gastro-esophageal reflux disease without esophagitis: Secondary | ICD-10-CM | POA: Diagnosis not present

## 2016-09-06 DIAGNOSIS — Z79899 Other long term (current) drug therapy: Secondary | ICD-10-CM | POA: Diagnosis not present

## 2016-09-06 DIAGNOSIS — Z7984 Long term (current) use of oral hypoglycemic drugs: Secondary | ICD-10-CM | POA: Diagnosis not present

## 2016-09-06 DIAGNOSIS — Z923 Personal history of irradiation: Secondary | ICD-10-CM | POA: Diagnosis not present

## 2016-09-06 DIAGNOSIS — E1165 Type 2 diabetes mellitus with hyperglycemia: Secondary | ICD-10-CM | POA: Diagnosis not present

## 2016-09-06 DIAGNOSIS — M4802 Spinal stenosis, cervical region: Secondary | ICD-10-CM | POA: Diagnosis not present

## 2016-09-06 DIAGNOSIS — N400 Enlarged prostate without lower urinary tract symptoms: Secondary | ICD-10-CM | POA: Diagnosis not present

## 2016-09-06 DIAGNOSIS — Z9221 Personal history of antineoplastic chemotherapy: Secondary | ICD-10-CM | POA: Diagnosis not present

## 2016-09-06 DIAGNOSIS — Z933 Colostomy status: Secondary | ICD-10-CM | POA: Diagnosis not present

## 2016-09-06 DIAGNOSIS — C786 Secondary malignant neoplasm of retroperitoneum and peritoneum: Secondary | ICD-10-CM | POA: Diagnosis not present

## 2016-09-06 DIAGNOSIS — G893 Neoplasm related pain (acute) (chronic): Secondary | ICD-10-CM | POA: Diagnosis not present

## 2016-09-06 DIAGNOSIS — E785 Hyperlipidemia, unspecified: Secondary | ICD-10-CM | POA: Diagnosis not present

## 2016-09-07 DIAGNOSIS — M542 Cervicalgia: Secondary | ICD-10-CM | POA: Diagnosis not present

## 2016-09-07 DIAGNOSIS — T148XXA Other injury of unspecified body region, initial encounter: Secondary | ICD-10-CM | POA: Diagnosis not present

## 2016-09-07 DIAGNOSIS — Z87891 Personal history of nicotine dependence: Secondary | ICD-10-CM | POA: Diagnosis not present

## 2016-09-07 DIAGNOSIS — R079 Chest pain, unspecified: Secondary | ICD-10-CM | POA: Diagnosis not present

## 2016-09-07 DIAGNOSIS — S0990XA Unspecified injury of head, initial encounter: Secondary | ICD-10-CM | POA: Diagnosis not present

## 2016-09-07 DIAGNOSIS — M79622 Pain in left upper arm: Secondary | ICD-10-CM | POA: Diagnosis not present

## 2016-09-07 DIAGNOSIS — S0093XA Contusion of unspecified part of head, initial encounter: Secondary | ICD-10-CM | POA: Diagnosis not present

## 2016-09-07 DIAGNOSIS — S40012A Contusion of left shoulder, initial encounter: Secondary | ICD-10-CM | POA: Diagnosis not present

## 2016-09-07 DIAGNOSIS — Z23 Encounter for immunization: Secondary | ICD-10-CM | POA: Diagnosis not present

## 2016-09-07 DIAGNOSIS — S0091XA Abrasion of unspecified part of head, initial encounter: Secondary | ICD-10-CM | POA: Diagnosis not present

## 2016-09-07 DIAGNOSIS — R51 Headache: Secondary | ICD-10-CM | POA: Diagnosis not present

## 2016-09-07 DIAGNOSIS — R0781 Pleurodynia: Secondary | ICD-10-CM | POA: Diagnosis not present

## 2016-09-07 DIAGNOSIS — S0031XA Abrasion of nose, initial encounter: Secondary | ICD-10-CM | POA: Diagnosis not present

## 2016-09-07 DIAGNOSIS — M25512 Pain in left shoulder: Secondary | ICD-10-CM | POA: Diagnosis not present

## 2016-09-07 DIAGNOSIS — S00511A Abrasion of lip, initial encounter: Secondary | ICD-10-CM | POA: Diagnosis not present

## 2016-09-07 DIAGNOSIS — J322 Chronic ethmoidal sinusitis: Secondary | ICD-10-CM | POA: Diagnosis not present

## 2016-10-17 DIAGNOSIS — H401132 Primary open-angle glaucoma, bilateral, moderate stage: Secondary | ICD-10-CM | POA: Diagnosis not present

## 2016-12-07 DIAGNOSIS — Z79891 Long term (current) use of opiate analgesic: Secondary | ICD-10-CM | POA: Diagnosis not present

## 2016-12-07 DIAGNOSIS — I10 Essential (primary) hypertension: Secondary | ICD-10-CM | POA: Diagnosis not present

## 2016-12-07 DIAGNOSIS — Z882 Allergy status to sulfonamides status: Secondary | ICD-10-CM | POA: Diagnosis not present

## 2016-12-07 DIAGNOSIS — K219 Gastro-esophageal reflux disease without esophagitis: Secondary | ICD-10-CM | POA: Diagnosis not present

## 2016-12-07 DIAGNOSIS — C189 Malignant neoplasm of colon, unspecified: Secondary | ICD-10-CM | POA: Diagnosis not present

## 2016-12-07 DIAGNOSIS — M4802 Spinal stenosis, cervical region: Secondary | ICD-10-CM | POA: Diagnosis not present

## 2016-12-07 DIAGNOSIS — G893 Neoplasm related pain (acute) (chronic): Secondary | ICD-10-CM | POA: Diagnosis not present

## 2016-12-07 DIAGNOSIS — J309 Allergic rhinitis, unspecified: Secondary | ICD-10-CM | POA: Diagnosis not present

## 2016-12-07 DIAGNOSIS — E785 Hyperlipidemia, unspecified: Secondary | ICD-10-CM | POA: Diagnosis not present

## 2016-12-07 DIAGNOSIS — C7951 Secondary malignant neoplasm of bone: Secondary | ICD-10-CM | POA: Diagnosis not present

## 2016-12-07 DIAGNOSIS — M25552 Pain in left hip: Secondary | ICD-10-CM | POA: Diagnosis not present

## 2016-12-07 DIAGNOSIS — C786 Secondary malignant neoplasm of retroperitoneum and peritoneum: Secondary | ICD-10-CM | POA: Diagnosis not present

## 2017-02-14 DIAGNOSIS — H401132 Primary open-angle glaucoma, bilateral, moderate stage: Secondary | ICD-10-CM | POA: Diagnosis not present

## 2017-03-22 DIAGNOSIS — C189 Malignant neoplasm of colon, unspecified: Secondary | ICD-10-CM | POA: Diagnosis not present

## 2017-04-03 DIAGNOSIS — I10 Essential (primary) hypertension: Secondary | ICD-10-CM | POA: Diagnosis not present

## 2017-04-03 DIAGNOSIS — R05 Cough: Secondary | ICD-10-CM | POA: Diagnosis not present

## 2017-04-03 DIAGNOSIS — E785 Hyperlipidemia, unspecified: Secondary | ICD-10-CM | POA: Diagnosis not present

## 2017-04-03 DIAGNOSIS — R0989 Other specified symptoms and signs involving the circulatory and respiratory systems: Secondary | ICD-10-CM | POA: Diagnosis not present

## 2017-04-03 DIAGNOSIS — E119 Type 2 diabetes mellitus without complications: Secondary | ICD-10-CM | POA: Diagnosis not present

## 2017-06-17 DIAGNOSIS — H401132 Primary open-angle glaucoma, bilateral, moderate stage: Secondary | ICD-10-CM | POA: Diagnosis not present

## 2017-06-17 DIAGNOSIS — E119 Type 2 diabetes mellitus without complications: Secondary | ICD-10-CM | POA: Diagnosis not present

## 2017-06-17 DIAGNOSIS — H2513 Age-related nuclear cataract, bilateral: Secondary | ICD-10-CM | POA: Diagnosis not present

## 2017-06-17 DIAGNOSIS — H25013 Cortical age-related cataract, bilateral: Secondary | ICD-10-CM | POA: Diagnosis not present

## 2017-06-17 DIAGNOSIS — H11153 Pinguecula, bilateral: Secondary | ICD-10-CM | POA: Diagnosis not present

## 2017-06-19 DIAGNOSIS — C189 Malignant neoplasm of colon, unspecified: Secondary | ICD-10-CM | POA: Diagnosis not present

## 2017-06-19 DIAGNOSIS — G893 Neoplasm related pain (acute) (chronic): Secondary | ICD-10-CM | POA: Diagnosis not present

## 2017-06-19 DIAGNOSIS — G894 Chronic pain syndrome: Secondary | ICD-10-CM | POA: Diagnosis not present

## 2017-06-19 DIAGNOSIS — C786 Secondary malignant neoplasm of retroperitoneum and peritoneum: Secondary | ICD-10-CM | POA: Diagnosis not present

## 2017-06-19 DIAGNOSIS — Z79891 Long term (current) use of opiate analgesic: Secondary | ICD-10-CM | POA: Diagnosis not present

## 2017-06-19 DIAGNOSIS — C7951 Secondary malignant neoplasm of bone: Secondary | ICD-10-CM | POA: Diagnosis not present

## 2017-06-25 DIAGNOSIS — C189 Malignant neoplasm of colon, unspecified: Secondary | ICD-10-CM | POA: Diagnosis not present

## 2017-06-25 DIAGNOSIS — G893 Neoplasm related pain (acute) (chronic): Secondary | ICD-10-CM | POA: Diagnosis not present

## 2017-06-25 DIAGNOSIS — Z79891 Long term (current) use of opiate analgesic: Secondary | ICD-10-CM | POA: Diagnosis not present

## 2017-06-25 DIAGNOSIS — C7951 Secondary malignant neoplasm of bone: Secondary | ICD-10-CM | POA: Diagnosis not present

## 2017-06-25 DIAGNOSIS — G894 Chronic pain syndrome: Secondary | ICD-10-CM | POA: Diagnosis not present

## 2017-06-27 DIAGNOSIS — M899 Disorder of bone, unspecified: Secondary | ICD-10-CM | POA: Diagnosis not present

## 2017-06-27 DIAGNOSIS — E079 Disorder of thyroid, unspecified: Secondary | ICD-10-CM | POA: Diagnosis not present

## 2017-06-27 DIAGNOSIS — R590 Localized enlarged lymph nodes: Secondary | ICD-10-CM | POA: Diagnosis not present

## 2017-06-27 DIAGNOSIS — C189 Malignant neoplasm of colon, unspecified: Secondary | ICD-10-CM | POA: Diagnosis not present

## 2017-06-28 DIAGNOSIS — C7951 Secondary malignant neoplasm of bone: Secondary | ICD-10-CM | POA: Diagnosis not present

## 2017-06-28 DIAGNOSIS — C189 Malignant neoplasm of colon, unspecified: Secondary | ICD-10-CM | POA: Diagnosis not present

## 2017-07-02 DIAGNOSIS — C189 Malignant neoplasm of colon, unspecified: Secondary | ICD-10-CM | POA: Diagnosis not present

## 2017-07-02 DIAGNOSIS — C7951 Secondary malignant neoplasm of bone: Secondary | ICD-10-CM | POA: Diagnosis not present

## 2017-07-02 DIAGNOSIS — Z510 Encounter for antineoplastic radiation therapy: Secondary | ICD-10-CM | POA: Diagnosis not present

## 2017-07-03 DIAGNOSIS — C189 Malignant neoplasm of colon, unspecified: Secondary | ICD-10-CM | POA: Diagnosis not present

## 2017-07-03 DIAGNOSIS — C7951 Secondary malignant neoplasm of bone: Secondary | ICD-10-CM | POA: Diagnosis not present

## 2017-07-03 DIAGNOSIS — Z510 Encounter for antineoplastic radiation therapy: Secondary | ICD-10-CM | POA: Diagnosis not present

## 2017-07-04 DIAGNOSIS — C7951 Secondary malignant neoplasm of bone: Secondary | ICD-10-CM | POA: Diagnosis not present

## 2017-07-04 DIAGNOSIS — Z510 Encounter for antineoplastic radiation therapy: Secondary | ICD-10-CM | POA: Diagnosis not present

## 2017-07-04 DIAGNOSIS — C189 Malignant neoplasm of colon, unspecified: Secondary | ICD-10-CM | POA: Diagnosis not present

## 2017-07-05 DIAGNOSIS — Z510 Encounter for antineoplastic radiation therapy: Secondary | ICD-10-CM | POA: Diagnosis not present

## 2017-07-05 DIAGNOSIS — C7951 Secondary malignant neoplasm of bone: Secondary | ICD-10-CM | POA: Diagnosis not present

## 2017-07-05 DIAGNOSIS — C189 Malignant neoplasm of colon, unspecified: Secondary | ICD-10-CM | POA: Diagnosis not present

## 2017-07-08 DIAGNOSIS — Z510 Encounter for antineoplastic radiation therapy: Secondary | ICD-10-CM | POA: Diagnosis not present

## 2017-07-08 DIAGNOSIS — C189 Malignant neoplasm of colon, unspecified: Secondary | ICD-10-CM | POA: Diagnosis not present

## 2017-07-08 DIAGNOSIS — C7951 Secondary malignant neoplasm of bone: Secondary | ICD-10-CM | POA: Diagnosis not present

## 2017-07-09 DIAGNOSIS — C7951 Secondary malignant neoplasm of bone: Secondary | ICD-10-CM | POA: Diagnosis not present

## 2017-07-09 DIAGNOSIS — Z510 Encounter for antineoplastic radiation therapy: Secondary | ICD-10-CM | POA: Diagnosis not present

## 2017-07-09 DIAGNOSIS — C189 Malignant neoplasm of colon, unspecified: Secondary | ICD-10-CM | POA: Diagnosis not present

## 2017-07-10 DIAGNOSIS — C7951 Secondary malignant neoplasm of bone: Secondary | ICD-10-CM | POA: Diagnosis not present

## 2017-07-10 DIAGNOSIS — C189 Malignant neoplasm of colon, unspecified: Secondary | ICD-10-CM | POA: Diagnosis not present

## 2017-07-10 DIAGNOSIS — Z510 Encounter for antineoplastic radiation therapy: Secondary | ICD-10-CM | POA: Diagnosis not present

## 2017-07-11 DIAGNOSIS — Z510 Encounter for antineoplastic radiation therapy: Secondary | ICD-10-CM | POA: Diagnosis not present

## 2017-07-11 DIAGNOSIS — C7951 Secondary malignant neoplasm of bone: Secondary | ICD-10-CM | POA: Diagnosis not present

## 2017-07-11 DIAGNOSIS — C189 Malignant neoplasm of colon, unspecified: Secondary | ICD-10-CM | POA: Diagnosis not present

## 2017-07-12 DIAGNOSIS — C7951 Secondary malignant neoplasm of bone: Secondary | ICD-10-CM | POA: Diagnosis not present

## 2017-07-12 DIAGNOSIS — Z510 Encounter for antineoplastic radiation therapy: Secondary | ICD-10-CM | POA: Diagnosis not present

## 2017-07-12 DIAGNOSIS — C189 Malignant neoplasm of colon, unspecified: Secondary | ICD-10-CM | POA: Diagnosis not present

## 2017-07-15 DIAGNOSIS — C7951 Secondary malignant neoplasm of bone: Secondary | ICD-10-CM | POA: Diagnosis not present

## 2017-07-15 DIAGNOSIS — Z510 Encounter for antineoplastic radiation therapy: Secondary | ICD-10-CM | POA: Diagnosis not present

## 2017-07-15 DIAGNOSIS — C189 Malignant neoplasm of colon, unspecified: Secondary | ICD-10-CM | POA: Diagnosis not present

## 2017-07-16 DIAGNOSIS — C7951 Secondary malignant neoplasm of bone: Secondary | ICD-10-CM | POA: Diagnosis not present

## 2017-07-16 DIAGNOSIS — C189 Malignant neoplasm of colon, unspecified: Secondary | ICD-10-CM | POA: Diagnosis not present

## 2017-07-18 DIAGNOSIS — G894 Chronic pain syndrome: Secondary | ICD-10-CM | POA: Diagnosis not present

## 2017-07-18 DIAGNOSIS — C7951 Secondary malignant neoplasm of bone: Secondary | ICD-10-CM | POA: Diagnosis not present

## 2017-07-18 DIAGNOSIS — Z79891 Long term (current) use of opiate analgesic: Secondary | ICD-10-CM | POA: Diagnosis not present

## 2017-07-18 DIAGNOSIS — C189 Malignant neoplasm of colon, unspecified: Secondary | ICD-10-CM | POA: Diagnosis not present

## 2017-07-18 DIAGNOSIS — Z79899 Other long term (current) drug therapy: Secondary | ICD-10-CM | POA: Diagnosis not present

## 2017-09-20 DIAGNOSIS — Z79891 Long term (current) use of opiate analgesic: Secondary | ICD-10-CM | POA: Diagnosis not present

## 2017-09-20 DIAGNOSIS — G894 Chronic pain syndrome: Secondary | ICD-10-CM | POA: Diagnosis not present

## 2017-09-20 DIAGNOSIS — C189 Malignant neoplasm of colon, unspecified: Secondary | ICD-10-CM | POA: Diagnosis not present

## 2017-09-20 DIAGNOSIS — R591 Generalized enlarged lymph nodes: Secondary | ICD-10-CM | POA: Diagnosis not present

## 2017-09-20 DIAGNOSIS — G893 Neoplasm related pain (acute) (chronic): Secondary | ICD-10-CM | POA: Diagnosis not present

## 2017-09-20 DIAGNOSIS — C7951 Secondary malignant neoplasm of bone: Secondary | ICD-10-CM | POA: Diagnosis not present

## 2017-10-21 DIAGNOSIS — H401132 Primary open-angle glaucoma, bilateral, moderate stage: Secondary | ICD-10-CM | POA: Diagnosis not present

## 2017-11-01 DIAGNOSIS — Z760 Encounter for issue of repeat prescription: Secondary | ICD-10-CM | POA: Diagnosis not present

## 2017-11-01 DIAGNOSIS — I10 Essential (primary) hypertension: Secondary | ICD-10-CM | POA: Diagnosis not present

## 2017-11-01 DIAGNOSIS — E785 Hyperlipidemia, unspecified: Secondary | ICD-10-CM | POA: Diagnosis not present

## 2017-11-01 DIAGNOSIS — E119 Type 2 diabetes mellitus without complications: Secondary | ICD-10-CM | POA: Diagnosis not present

## 2017-12-27 DIAGNOSIS — G894 Chronic pain syndrome: Secondary | ICD-10-CM | POA: Diagnosis not present

## 2017-12-27 DIAGNOSIS — Z79891 Long term (current) use of opiate analgesic: Secondary | ICD-10-CM | POA: Diagnosis not present

## 2017-12-27 DIAGNOSIS — G893 Neoplasm related pain (acute) (chronic): Secondary | ICD-10-CM | POA: Diagnosis not present

## 2017-12-27 DIAGNOSIS — C189 Malignant neoplasm of colon, unspecified: Secondary | ICD-10-CM | POA: Diagnosis not present

## 2017-12-27 DIAGNOSIS — K669 Disorder of peritoneum, unspecified: Secondary | ICD-10-CM | POA: Diagnosis not present

## 2017-12-27 DIAGNOSIS — C7951 Secondary malignant neoplasm of bone: Secondary | ICD-10-CM | POA: Diagnosis not present

## 2017-12-27 DIAGNOSIS — R591 Generalized enlarged lymph nodes: Secondary | ICD-10-CM | POA: Diagnosis not present

## 2018-02-24 DIAGNOSIS — H401132 Primary open-angle glaucoma, bilateral, moderate stage: Secondary | ICD-10-CM | POA: Diagnosis not present

## 2018-03-07 IMAGING — CT CT PELVIS W/O CM
2 of 3 series · 15 of 46 positions shown, 17 images · non-contrast
Comparison: Radiograph 05/20/2016, CT 04/14/2014

CLINICAL DATA: LEFT hip pain.  Abnormal radiograph

EXAM:
CT PELVIS WITHOUT CONTRAST
TECHNIQUE: Multidetector CT imaging of the pelvis was performed following the
standard protocol without intravenous contrast.

[Series 7: coronal st · coronal · 0.56mm/px · 3 of 112 slices shown]
[im 38/112  soft-tissue]
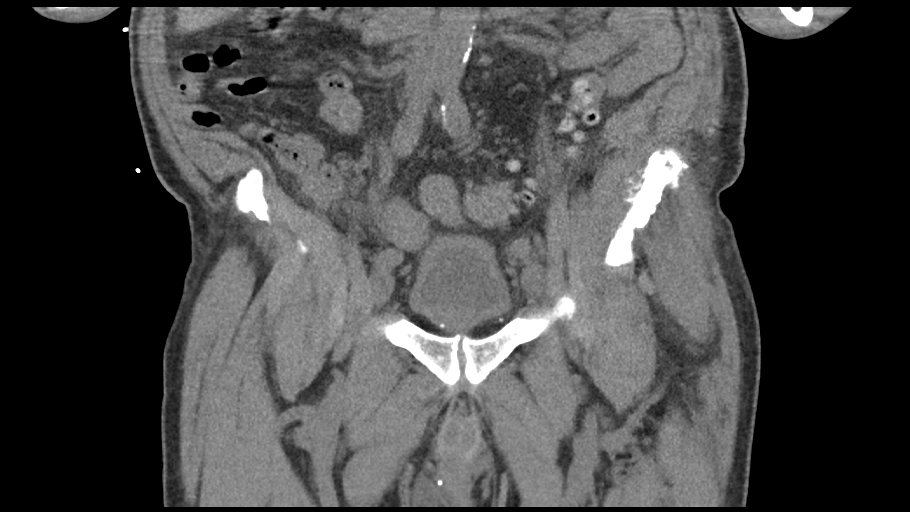
[im 50/112  soft-tissue]
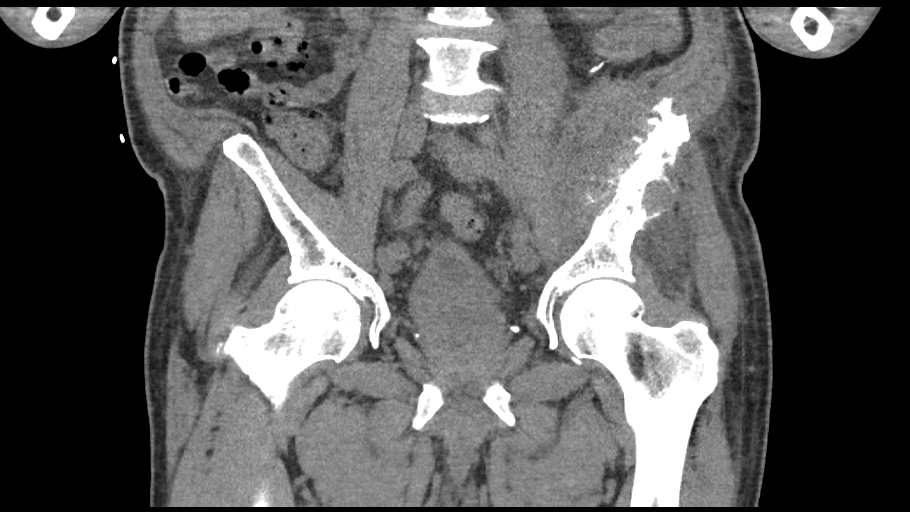
[im 62/112  soft-tissue]
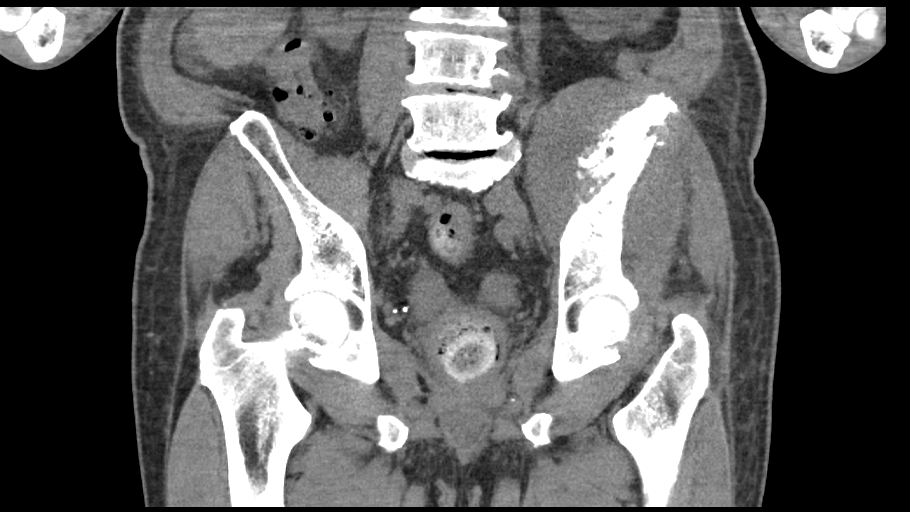

[Series 9: axial soft tissue · axial · 0.82mm/px · z∈[-231,+13]mm · 12 of 140 slices shown, 14 images]
[im 9/140  soft-tissue]
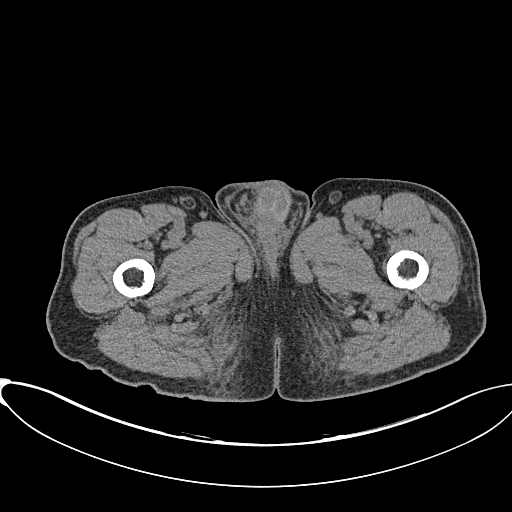
[im 9/140  bone]
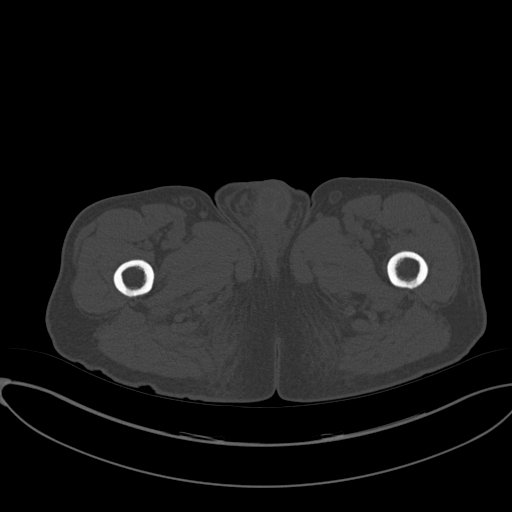
[im 18/140  soft-tissue]
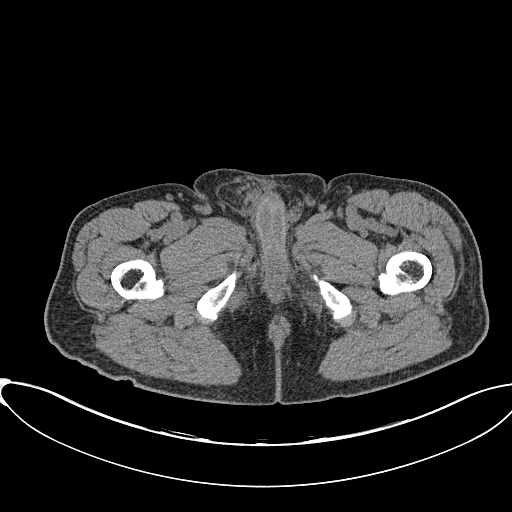
[im 32/140  soft-tissue]
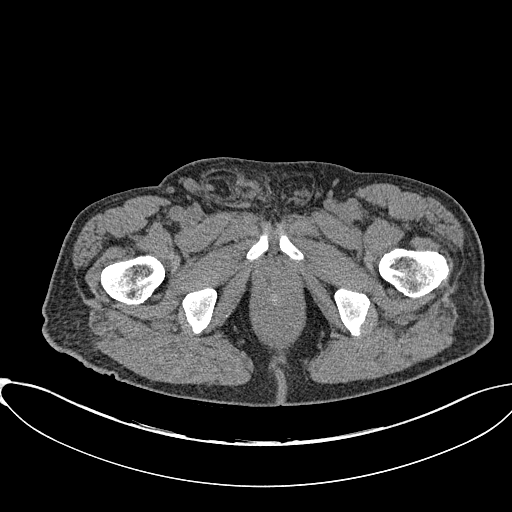
[im 41/140  soft-tissue]
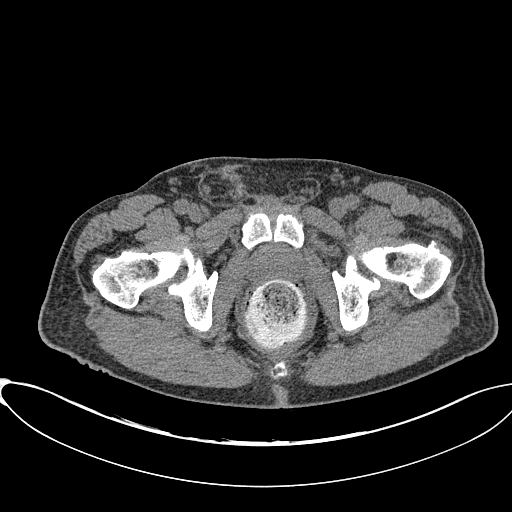
[im 54/140  soft-tissue]
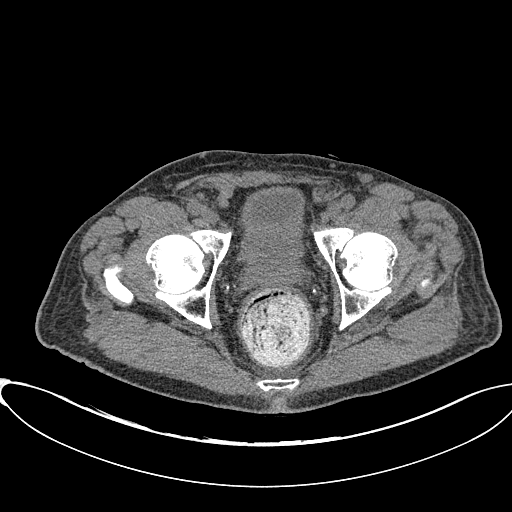
[im 63/140  soft-tissue]
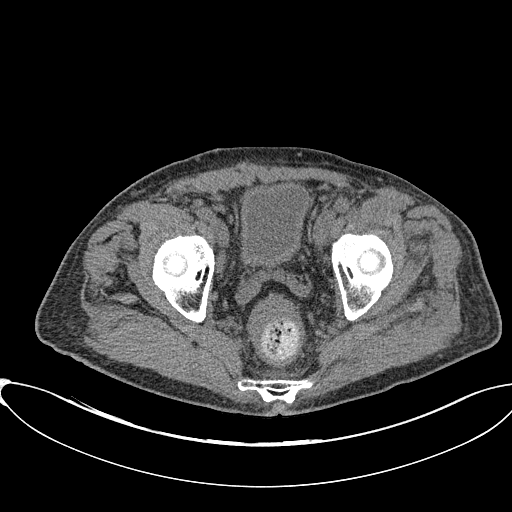
[im 77/140  soft-tissue]
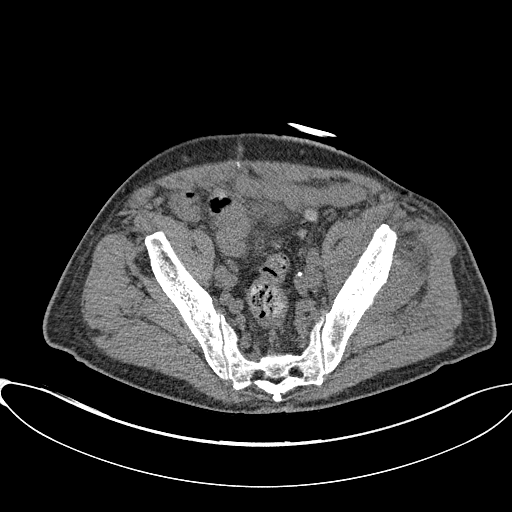
[im 86/140  soft-tissue]
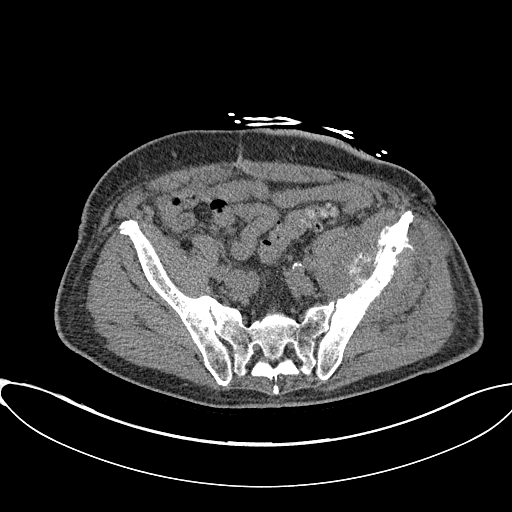
[im 99/140  soft-tissue]
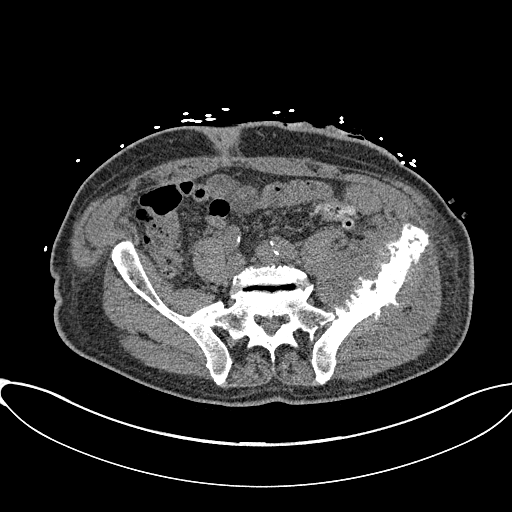
[im 99/140  bone]
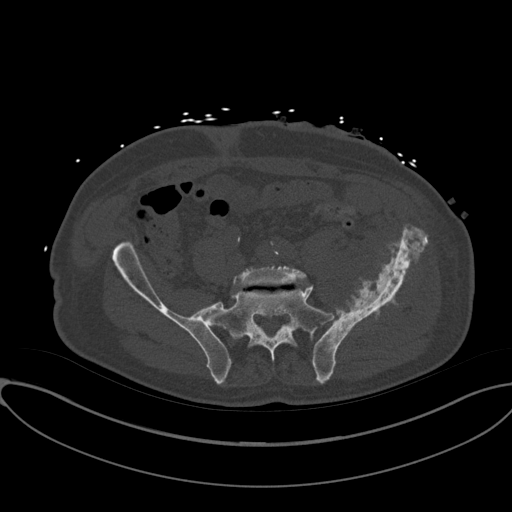
[im 108/140  soft-tissue]
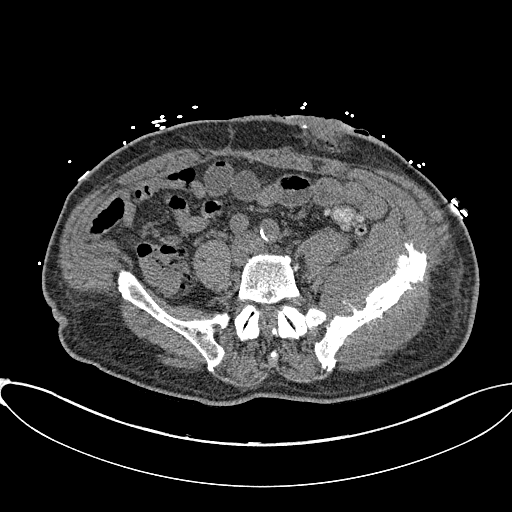
[im 122/140  soft-tissue]
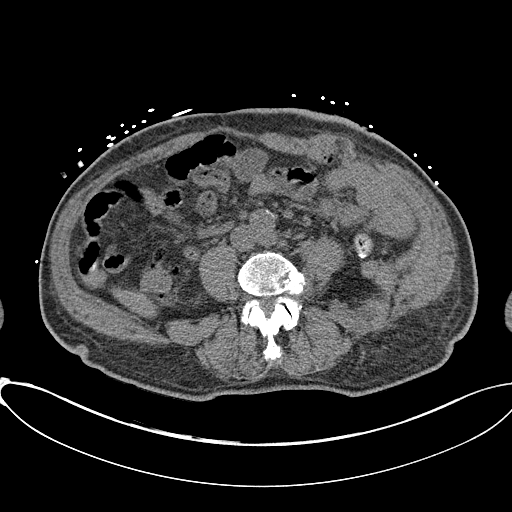
[im 131/140  soft-tissue]
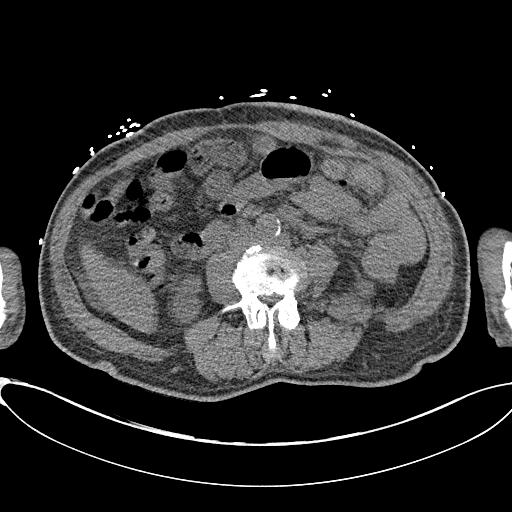

[15 of 46 positions shown; findings below may reference images not displayed]

FINDINGS: Urinary Tract:  Unremarkable

Bowel: Residual barium contrast within the excluded rectosigmoid
colon. Several diverticula of the sigmoid colon. No acute
inflammation.

Vascular/Lymphatic: abdominal aorta is calcified. No retroperitoneal
adenopathy.

Reproductive:  Prostate mildly enlarged at 15 mm.

Other:  LEFT lower quadrant colostomy

Musculoskeletal: There is a permeative lesion involving the entirety
of the LEFT iliac crests with a robust periosteal reaction
consistent with aggressive osseous lesion. There is expansion into
adjacent iliacus muscle measuring 3 cm in thickness compared to the
normal 1.3 cm thick iliacus muscle on the RIGHT.
IMPRESSION: 1. Aggressive lesion in the LEFT iliac bone with robust periosteal
reaction is most consistent with neoplasm. Favor metastatic lesion
over a primary bone lesion.
2. Inflammation / extension to the adjacent musculature.

## 2018-03-07 IMAGING — DX DG HIP (WITH OR WITHOUT PELVIS) 2-3V*L*
3 series · 3 of 3 positions shown · non-contrast
Comparison: 04/14/2014 CT

CLINICAL DATA: Left hip pain for several days. No known injury.
History of colon cancer.

EXAM:
DG HIP (WITH OR WITHOUT PELVIS) 2-3V LEFT

[pelvis ap]
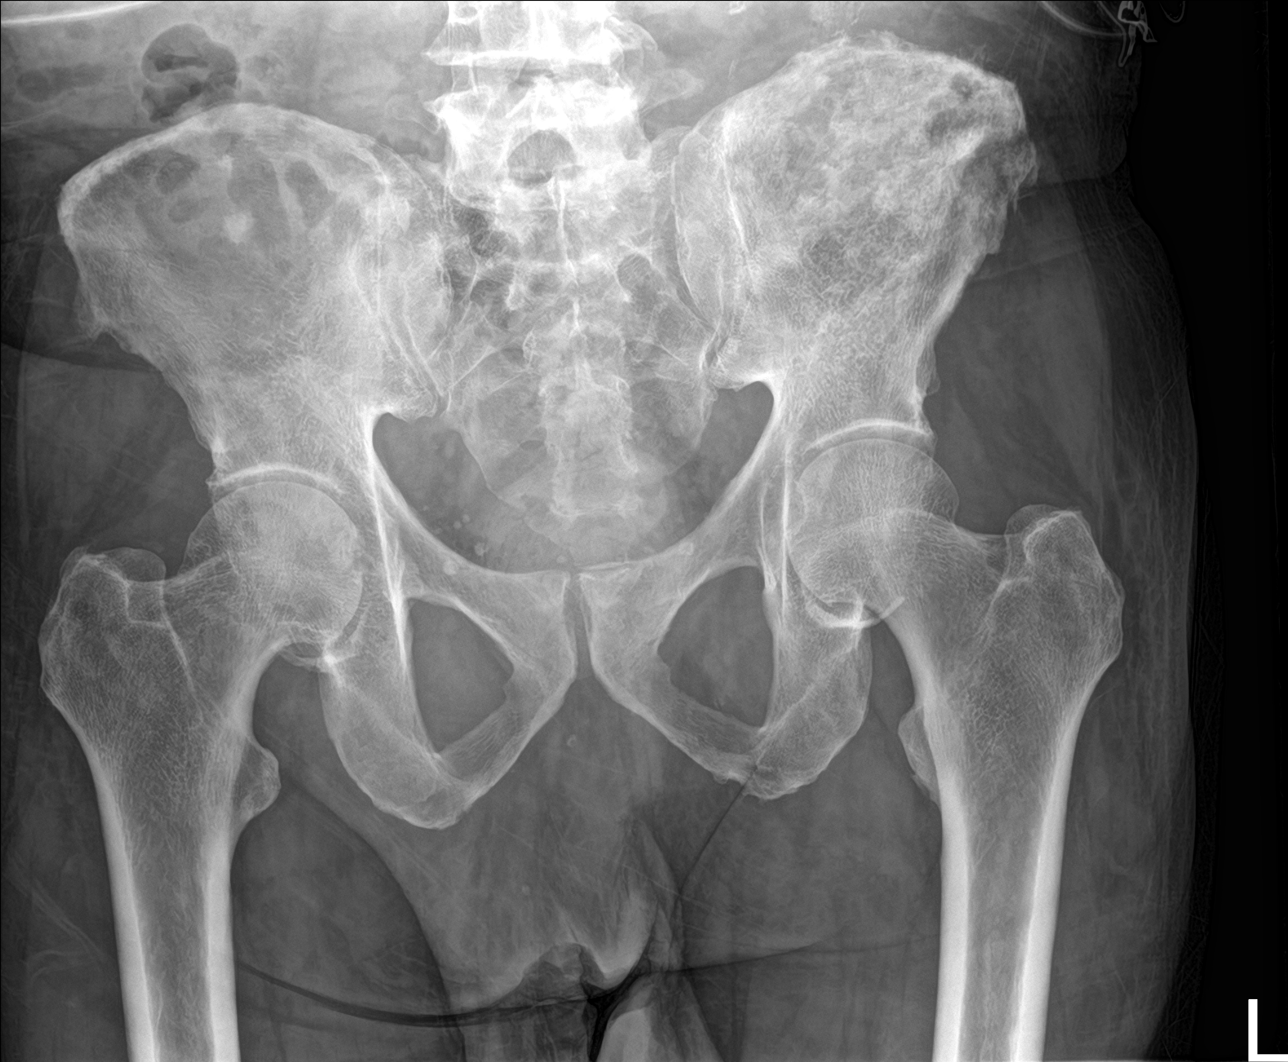

[hip ap]
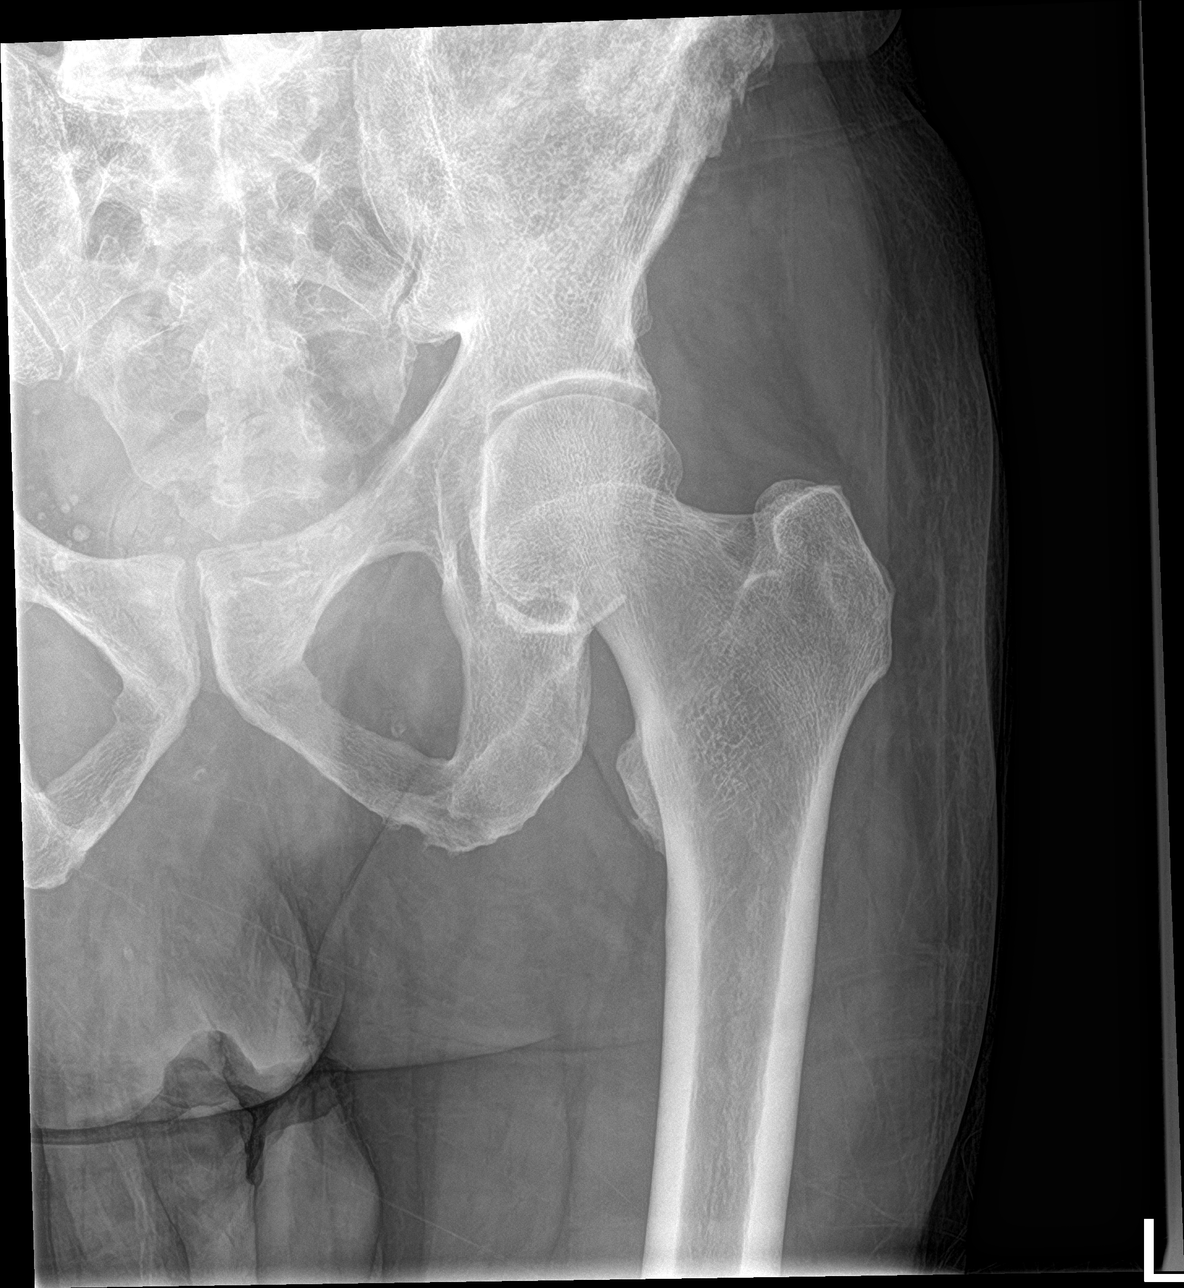

[hip lat]
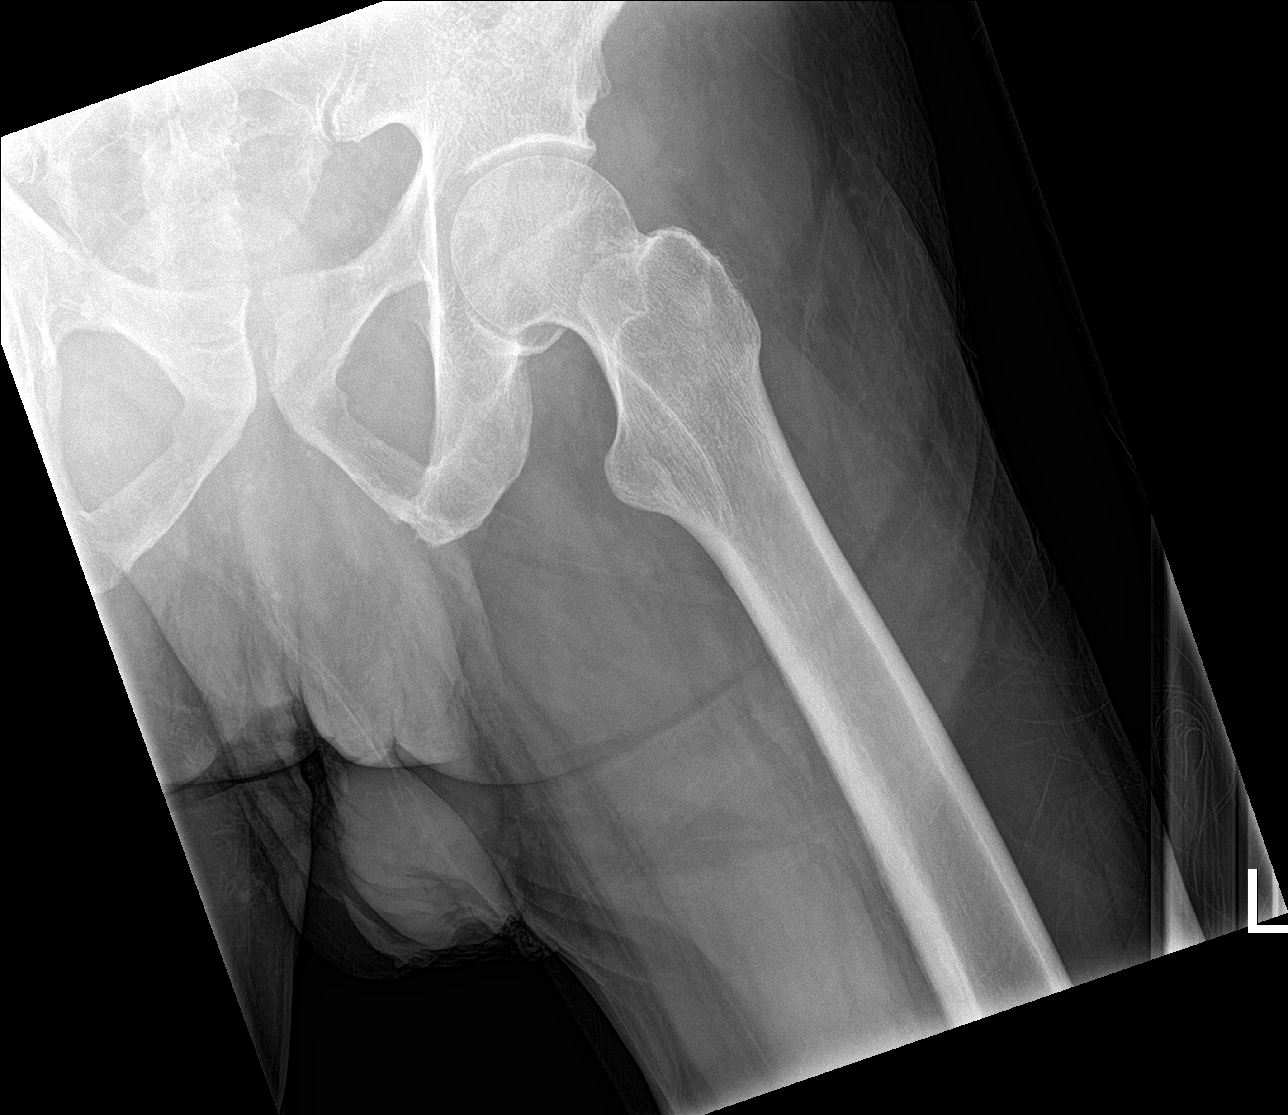

[3 of 3 positions shown; findings below may reference images not displayed]

FINDINGS: There is no evidence of hip fracture, subluxation or dislocation.
Mild degenerative changes in the hips noted.

There is increased irregular density overlying the left iliac crest.
This may be related to this patient's colostomy, but difficult to
exclude a bony lesion on this study.

No other focal bony abnormalities are noted.
IMPRESSION: No evidence of acute hip abnormality.

Irregular increased density overlying the left iliac crest. Although
this may be related to this patient's colostomy, a focal bony lesion
is difficult to exclude. Consider dedicated and oblique views as
indicated.

## 2018-04-14 DIAGNOSIS — C189 Malignant neoplasm of colon, unspecified: Secondary | ICD-10-CM | POA: Diagnosis not present

## 2018-04-14 DIAGNOSIS — G894 Chronic pain syndrome: Secondary | ICD-10-CM | POA: Diagnosis not present

## 2018-04-14 DIAGNOSIS — C7951 Secondary malignant neoplasm of bone: Secondary | ICD-10-CM | POA: Diagnosis not present

## 2018-06-17 DIAGNOSIS — R600 Localized edema: Secondary | ICD-10-CM | POA: Diagnosis not present

## 2018-06-17 DIAGNOSIS — E119 Type 2 diabetes mellitus without complications: Secondary | ICD-10-CM | POA: Diagnosis not present

## 2018-06-17 DIAGNOSIS — M7989 Other specified soft tissue disorders: Secondary | ICD-10-CM | POA: Diagnosis not present

## 2018-06-26 DIAGNOSIS — C189 Malignant neoplasm of colon, unspecified: Secondary | ICD-10-CM | POA: Diagnosis not present

## 2018-06-26 DIAGNOSIS — M7989 Other specified soft tissue disorders: Secondary | ICD-10-CM | POA: Diagnosis not present

## 2018-06-26 DIAGNOSIS — R590 Localized enlarged lymph nodes: Secondary | ICD-10-CM | POA: Diagnosis not present

## 2018-06-26 DIAGNOSIS — R591 Generalized enlarged lymph nodes: Secondary | ICD-10-CM | POA: Diagnosis not present

## 2018-06-26 DIAGNOSIS — C7951 Secondary malignant neoplasm of bone: Secondary | ICD-10-CM | POA: Diagnosis not present

## 2018-06-26 DIAGNOSIS — G894 Chronic pain syndrome: Secondary | ICD-10-CM | POA: Diagnosis not present

## 2018-07-03 DIAGNOSIS — E119 Type 2 diabetes mellitus without complications: Secondary | ICD-10-CM | POA: Diagnosis not present

## 2018-07-03 DIAGNOSIS — H2513 Age-related nuclear cataract, bilateral: Secondary | ICD-10-CM | POA: Diagnosis not present

## 2018-07-03 DIAGNOSIS — Z7984 Long term (current) use of oral hypoglycemic drugs: Secondary | ICD-10-CM | POA: Diagnosis not present

## 2018-07-03 DIAGNOSIS — H25013 Cortical age-related cataract, bilateral: Secondary | ICD-10-CM | POA: Diagnosis not present

## 2018-07-03 DIAGNOSIS — H401132 Primary open-angle glaucoma, bilateral, moderate stage: Secondary | ICD-10-CM | POA: Diagnosis not present

## 2018-07-04 DIAGNOSIS — C7951 Secondary malignant neoplasm of bone: Secondary | ICD-10-CM | POA: Diagnosis not present

## 2018-07-04 DIAGNOSIS — C189 Malignant neoplasm of colon, unspecified: Secondary | ICD-10-CM | POA: Diagnosis not present

## 2018-07-11 DIAGNOSIS — C189 Malignant neoplasm of colon, unspecified: Secondary | ICD-10-CM | POA: Diagnosis not present

## 2018-07-11 DIAGNOSIS — C7951 Secondary malignant neoplasm of bone: Secondary | ICD-10-CM | POA: Diagnosis not present

## 2018-07-21 DIAGNOSIS — C189 Malignant neoplasm of colon, unspecified: Secondary | ICD-10-CM | POA: Diagnosis not present

## 2018-07-21 DIAGNOSIS — Z510 Encounter for antineoplastic radiation therapy: Secondary | ICD-10-CM | POA: Diagnosis not present

## 2018-07-21 DIAGNOSIS — C7951 Secondary malignant neoplasm of bone: Secondary | ICD-10-CM | POA: Diagnosis not present

## 2018-07-22 DIAGNOSIS — C189 Malignant neoplasm of colon, unspecified: Secondary | ICD-10-CM | POA: Diagnosis not present

## 2018-07-22 DIAGNOSIS — C7951 Secondary malignant neoplasm of bone: Secondary | ICD-10-CM | POA: Diagnosis not present

## 2018-07-22 DIAGNOSIS — Z510 Encounter for antineoplastic radiation therapy: Secondary | ICD-10-CM | POA: Diagnosis not present

## 2018-07-23 DIAGNOSIS — C189 Malignant neoplasm of colon, unspecified: Secondary | ICD-10-CM | POA: Diagnosis not present

## 2018-07-23 DIAGNOSIS — C7951 Secondary malignant neoplasm of bone: Secondary | ICD-10-CM | POA: Diagnosis not present

## 2018-07-23 DIAGNOSIS — Z510 Encounter for antineoplastic radiation therapy: Secondary | ICD-10-CM | POA: Diagnosis not present

## 2018-07-24 DIAGNOSIS — C7951 Secondary malignant neoplasm of bone: Secondary | ICD-10-CM | POA: Diagnosis not present

## 2018-07-24 DIAGNOSIS — Z510 Encounter for antineoplastic radiation therapy: Secondary | ICD-10-CM | POA: Diagnosis not present

## 2018-07-24 DIAGNOSIS — C189 Malignant neoplasm of colon, unspecified: Secondary | ICD-10-CM | POA: Diagnosis not present

## 2018-07-25 DIAGNOSIS — Z510 Encounter for antineoplastic radiation therapy: Secondary | ICD-10-CM | POA: Diagnosis not present

## 2018-07-25 DIAGNOSIS — C189 Malignant neoplasm of colon, unspecified: Secondary | ICD-10-CM | POA: Diagnosis not present

## 2018-07-25 DIAGNOSIS — C7951 Secondary malignant neoplasm of bone: Secondary | ICD-10-CM | POA: Diagnosis not present

## 2018-07-28 DIAGNOSIS — Z510 Encounter for antineoplastic radiation therapy: Secondary | ICD-10-CM | POA: Diagnosis not present

## 2018-07-28 DIAGNOSIS — C189 Malignant neoplasm of colon, unspecified: Secondary | ICD-10-CM | POA: Diagnosis not present

## 2018-07-28 DIAGNOSIS — C7951 Secondary malignant neoplasm of bone: Secondary | ICD-10-CM | POA: Diagnosis not present

## 2018-07-29 DIAGNOSIS — Z510 Encounter for antineoplastic radiation therapy: Secondary | ICD-10-CM | POA: Diagnosis not present

## 2018-07-29 DIAGNOSIS — C189 Malignant neoplasm of colon, unspecified: Secondary | ICD-10-CM | POA: Diagnosis not present

## 2018-07-29 DIAGNOSIS — C7951 Secondary malignant neoplasm of bone: Secondary | ICD-10-CM | POA: Diagnosis not present

## 2018-07-30 DIAGNOSIS — Z510 Encounter for antineoplastic radiation therapy: Secondary | ICD-10-CM | POA: Diagnosis not present

## 2018-07-30 DIAGNOSIS — C7951 Secondary malignant neoplasm of bone: Secondary | ICD-10-CM | POA: Diagnosis not present

## 2018-07-30 DIAGNOSIS — C189 Malignant neoplasm of colon, unspecified: Secondary | ICD-10-CM | POA: Diagnosis not present

## 2018-08-01 DIAGNOSIS — Z510 Encounter for antineoplastic radiation therapy: Secondary | ICD-10-CM | POA: Diagnosis not present

## 2018-08-01 DIAGNOSIS — C189 Malignant neoplasm of colon, unspecified: Secondary | ICD-10-CM | POA: Diagnosis not present

## 2018-08-01 DIAGNOSIS — C7951 Secondary malignant neoplasm of bone: Secondary | ICD-10-CM | POA: Diagnosis not present

## 2018-08-04 DIAGNOSIS — C7951 Secondary malignant neoplasm of bone: Secondary | ICD-10-CM | POA: Diagnosis not present

## 2018-08-04 DIAGNOSIS — C189 Malignant neoplasm of colon, unspecified: Secondary | ICD-10-CM | POA: Diagnosis not present

## 2018-08-04 DIAGNOSIS — Z510 Encounter for antineoplastic radiation therapy: Secondary | ICD-10-CM | POA: Diagnosis not present

## 2018-08-05 DIAGNOSIS — Z510 Encounter for antineoplastic radiation therapy: Secondary | ICD-10-CM | POA: Diagnosis not present

## 2018-08-05 DIAGNOSIS — C7951 Secondary malignant neoplasm of bone: Secondary | ICD-10-CM | POA: Diagnosis not present

## 2018-08-05 DIAGNOSIS — C189 Malignant neoplasm of colon, unspecified: Secondary | ICD-10-CM | POA: Diagnosis not present

## 2018-08-31 ENCOUNTER — Emergency Department (HOSPITAL_BASED_OUTPATIENT_CLINIC_OR_DEPARTMENT_OTHER): Payer: Medicare Other

## 2018-08-31 ENCOUNTER — Encounter (HOSPITAL_BASED_OUTPATIENT_CLINIC_OR_DEPARTMENT_OTHER): Payer: Self-pay | Admitting: Emergency Medicine

## 2018-08-31 ENCOUNTER — Observation Stay (HOSPITAL_BASED_OUTPATIENT_CLINIC_OR_DEPARTMENT_OTHER)
Admission: EM | Admit: 2018-08-31 | Discharge: 2018-09-01 | Disposition: A | Discharge status: Home / Self Care | Payer: Medicare Other | Attending: Family Medicine | Admitting: Family Medicine

## 2018-08-31 ENCOUNTER — Other Ambulatory Visit: Payer: Self-pay

## 2018-08-31 DIAGNOSIS — C7951 Secondary malignant neoplasm of bone: Secondary | ICD-10-CM | POA: Diagnosis not present

## 2018-08-31 DIAGNOSIS — Z87891 Personal history of nicotine dependence: Secondary | ICD-10-CM | POA: Diagnosis not present

## 2018-08-31 DIAGNOSIS — I10 Essential (primary) hypertension: Secondary | ICD-10-CM | POA: Diagnosis not present

## 2018-08-31 DIAGNOSIS — C189 Malignant neoplasm of colon, unspecified: Secondary | ICD-10-CM | POA: Diagnosis present

## 2018-08-31 DIAGNOSIS — Z79891 Long term (current) use of opiate analgesic: Secondary | ICD-10-CM | POA: Insufficient documentation

## 2018-08-31 DIAGNOSIS — R0789 Other chest pain: Secondary | ICD-10-CM | POA: Diagnosis present

## 2018-08-31 DIAGNOSIS — E119 Type 2 diabetes mellitus without complications: Secondary | ICD-10-CM | POA: Diagnosis not present

## 2018-08-31 DIAGNOSIS — R079 Chest pain, unspecified: Secondary | ICD-10-CM | POA: Diagnosis present

## 2018-08-31 DIAGNOSIS — G893 Neoplasm related pain (acute) (chronic): Secondary | ICD-10-CM | POA: Diagnosis not present

## 2018-08-31 DIAGNOSIS — Z7984 Long term (current) use of oral hypoglycemic drugs: Secondary | ICD-10-CM | POA: Insufficient documentation

## 2018-08-31 DIAGNOSIS — R072 Precordial pain: Secondary | ICD-10-CM | POA: Diagnosis not present

## 2018-08-31 DIAGNOSIS — D630 Anemia in neoplastic disease: Secondary | ICD-10-CM | POA: Insufficient documentation

## 2018-08-31 DIAGNOSIS — I313 Pericardial effusion (noninflammatory): Secondary | ICD-10-CM | POA: Insufficient documentation

## 2018-08-31 DIAGNOSIS — Z882 Allergy status to sulfonamides status: Secondary | ICD-10-CM | POA: Insufficient documentation

## 2018-08-31 LAB — TROPONIN I: Troponin I: <0.03 ng/mL (ref <0.03)

## 2018-08-31 LAB — CBC
HEMATOCRIT: 34.8 % — AB (ref 39.0–52.0)
HEMOGLOBIN: 10.2 g/dL — AB (ref 13.0–17.0)
MCH: 24.6 pg — ABNORMAL LOW (ref 26.0–34.0)
MCHC: 29.3 g/dL — ABNORMAL LOW (ref 30.0–36.0)
MCV: 84.1 fL (ref 80.0–100.0)
NRBC: 0 % (ref 0.0–0.2)
Platelets: 237 10*3/uL (ref 150–400)
RBC: 4.14 MIL/uL — ABNORMAL LOW (ref 4.22–5.81)
RDW: 15.7 % — AB (ref 11.5–15.5)
WBC: 4.7 10*3/uL (ref 4.0–10.5)

## 2018-08-31 LAB — BASIC METABOLIC PANEL
Anion gap: 7 (ref 5–15)
BUN: 10 mg/dL (ref 8–23)
CHLORIDE: 103 mmol/L (ref 98–111)
CO2: 28 mmol/L (ref 22–32)
CREATININE: 0.61 mg/dL (ref 0.61–1.24)
Calcium: 8.4 mg/dL — ABNORMAL LOW (ref 8.9–10.3)
GFR calc non Af Amer: >60 mL/min (ref >60)
Glucose, Bld: 134 mg/dL — ABNORMAL HIGH (ref 70–99)
POTASSIUM: 3.6 mmol/L (ref 3.5–5.1)
Sodium: 138 mmol/L (ref 135–145)

## 2018-08-31 LAB — D-DIMER, QUANTITATIVE: D-Dimer, Quant: 4.93 ug/mL-FEU — ABNORMAL HIGH (ref 0.00–0.50)

## 2018-08-31 MED ORDER — HYDROCODONE-ACETAMINOPHEN 5-325 MG PO TABS
1.0000 | ORAL_TABLET | ORAL | Status: DC | PRN
  Administered 2018-09-01 (×2): 1 via ORAL
  Filled 2018-08-31 (×2): qty 1

## 2018-08-31 MED ORDER — PANTOPRAZOLE SODIUM 40 MG PO TBEC
40.0000 mg | DELAYED_RELEASE_TABLET | Freq: Every day | ORAL | Status: DC
  Administered 2018-09-01: 40 mg via ORAL
  Filled 2018-08-31: qty 1

## 2018-08-31 MED ORDER — IOPAMIDOL (ISOVUE-370) INJECTION 76%
100.0000 mL | Freq: Once | INTRAVENOUS | Status: AC | PRN
  Administered 2018-08-31: 63 mL via INTRAVENOUS

## 2018-08-31 MED ORDER — FENTANYL 100 MCG/HR TD PT72: 200.0000 ug | MEDICATED_PATCH | TRANSDERMAL | Status: DC

## 2018-08-31 MED ORDER — KETOROLAC TROMETHAMINE 30 MG/ML IJ SOLN
15.0000 mg | Freq: Once | INTRAMUSCULAR | Status: AC
  Administered 2018-08-31: 15 mg via INTRAVENOUS
  Filled 2018-08-31: qty 1

## 2018-08-31 MED ORDER — INSULIN ASPART 100 UNIT/ML ~~LOC~~ SOLN
0.0000 [IU] | Freq: Three times a day (TID) | SUBCUTANEOUS | Status: DC
  Administered 2018-09-01: 2 [IU] via SUBCUTANEOUS

## 2018-08-31 MED ORDER — CITALOPRAM HYDROBROMIDE 20 MG PO TABS
20.0000 mg | ORAL_TABLET | Freq: Every day | ORAL | Status: DC
  Administered 2018-09-01: 20 mg via ORAL
  Filled 2018-08-31: qty 1

## 2018-08-31 MED ORDER — ONDANSETRON HCL 4 MG/2ML IJ SOLN: 4.0000 mg | Freq: Four times a day (QID) | INTRAMUSCULAR | Status: DC | PRN

## 2018-08-31 MED ORDER — METHADONE HCL 5 MG PO TABS
5.0000 mg | ORAL_TABLET | Freq: Two times a day (BID) | ORAL | Status: DC
  Administered 2018-08-31 – 2018-09-01 (×2): 5 mg via ORAL
  Filled 2018-08-31 (×2): qty 1

## 2018-08-31 MED ORDER — ONDANSETRON HCL 4 MG PO TABS: 4.0000 mg | ORAL_TABLET | Freq: Four times a day (QID) | ORAL | Status: DC | PRN

## 2018-08-31 MED ORDER — LISINOPRIL 10 MG PO TABS
10.0000 mg | ORAL_TABLET | Freq: Every day | ORAL | Status: DC
  Administered 2018-09-01: 10 mg via ORAL
  Filled 2018-08-31: qty 1

## 2018-08-31 MED ORDER — ACETAMINOPHEN 325 MG PO TABS
650.0000 mg | ORAL_TABLET | Freq: Four times a day (QID) | ORAL | Status: DC | PRN
  Administered 2018-09-01: 650 mg via ORAL
  Filled 2018-08-31: qty 2

## 2018-08-31 MED ORDER — GABAPENTIN 300 MG PO CAPS
300.0000 mg | ORAL_CAPSULE | Freq: Three times a day (TID) | ORAL | Status: DC
  Administered 2018-08-31 – 2018-09-01 (×3): 300 mg via ORAL
  Filled 2018-08-31 (×3): qty 1

## 2018-08-31 MED ORDER — ACETAMINOPHEN 650 MG RE SUPP: 650.0000 mg | Freq: Four times a day (QID) | RECTAL | Status: DC | PRN

## 2018-08-31 NOTE — ED Notes (Addendum)
Pt provided snack per request. NAD noted. VSS

## 2018-08-31 NOTE — ED Notes (Signed)
Patient transported to X-ray 

## 2018-08-31 NOTE — ED Notes (Signed)
Attempted to give report to floor RN- unavailable at this time. Will return call.

## 2018-08-31 NOTE — ED Provider Notes (Signed)
Emergency Department Provider Note   I have reviewed the triage vital signs and the nursing notes.   HISTORY  Chief Complaint Chest Pain   HPI Dakota Malone is a 77 y.o. male with PMH of colon cancer and DM presents to the emergency department for evaluation of right-sided chest pain which is been persistent for the past week.  Patient denies any significant shortness of breath, fevers, chills.  No sick contacts.  Pain is slightly worse with breathing.  No exertional chest pain.  Patient states that he is undergoing radiation for colon cancer which he finished within the last 2 weeks. No radiation or modifying factors.   Past Medical History:  Diagnosis Date  . Colon cancer ([place])   . Diabetes mellitus     Patient Active Problem List   Diagnosis Date Noted  . Chest wall pain 08/31/2018  . LUMBAR SPRAIN AND STRAIN 05/22/2010  . DIABETES MELLITUS, TYPE I 05/15/2010  . HYPERTENSION 05/15/2010  . RIB PAIN, LEFT SIDED 05/15/2010  . CONTUSION, LEFT CHEST WALL 05/15/2010    Past Surgical History:  Procedure Laterality Date  . COLECTOMY    . COLECTOMY    . COLON SURGERY    . COLOSTOMY      Allergies Sulfa antibiotics  No family history on file.  Social History Social History   Tobacco Use  . Smoking status: Former Research scientist (life sciences)  . Smokeless tobacco: Never Used  Substance Use Topics  . Alcohol use: No  . Drug use: No    Review of Systems  Constitutional: No fever/chills Eyes: No visual changes. ENT: No sore throat. Cardiovascular: Positive chest pain. Respiratory: Denies shortness of breath. Gastrointestinal: No abdominal pain.  No nausea, no vomiting.  No diarrhea.  No constipation. Genitourinary: Negative for dysuria. Musculoskeletal: Negative for back pain. Skin: Negative for rash. Neurological: Negative for headaches, focal weakness or numbness.  10-point ROS otherwise negative.  ____________________________________________   PHYSICAL  EXAM:  VITAL SIGNS: ED Triage Vitals  Enc Vitals Group     BP 08/31/18 1239 121/69     Pulse Rate 08/31/18 1239 (!) 52     Resp 08/31/18 1239 18     Temp 08/31/18 1242 98.5 F (36.9 C)     Temp Source 08/31/18 1242 Oral     SpO2 08/31/18 1239 98 %     Weight 08/31/18 1239 180 lb (81.6 kg)     Height 08/31/18 1239 5\' 9"  (1.753 m)     Pain Score 08/31/18 1239 8   Constitutional: Alert and oriented. Well appearing and in no acute distress. Eyes: Conjunctivae are normal. Head: Atraumatic. Nose: No congestion/rhinnorhea. Mouth/Throat: Mucous membranes are moist.  Neck: No stridor.  Cardiovascular: Bradycardia. Good peripheral circulation. Grossly normal heart sounds.   Respiratory: Normal respiratory effort.  No retractions. Lungs CTAB. Gastrointestinal: Soft and nontender. No distention.  Musculoskeletal: No lower extremity tenderness nor edema. No gross deformities of extremities. Neurologic:  Normal speech and language. No gross focal neurologic deficits are appreciated.  Skin:  Skin is warm, dry and intact. No rash noted.  ____________________________________________   LABS (all labs ordered are listed, but only abnormal results are displayed)  Labs Reviewed  BASIC METABOLIC PANEL - Abnormal; Notable for the following components:      Result Value   Glucose, Bld 134 (*)    Calcium 8.4 (*)    All other components within normal limits  CBC - Abnormal; Notable for the following components:   RBC 4.14 (*)  Hemoglobin 10.2 (*)    HCT 34.8 (*)    MCH 24.6 (*)    MCHC 29.3 (*)    RDW 15.7 (*)    All other components within normal limits  D-DIMER, QUANTITATIVE (NOT AT [facility]) - Abnormal; Notable for the following components:   D-Dimer, Quant 4.93 (*)    All other components within normal limits  TROPONIN I   ____________________________________________  EKG   EKG Interpretation  Date/Time:  Sunday August 31 2018 12:42:16 EST Ventricular Rate:  53 PR  Interval:  180 QRS Duration: 86 QT Interval:  444 QTC Calculation: 416 R Axis:   64 Text Interpretation:  Sinus bradycardia Cannot rule out Anterior infarct , age undetermined Abnormal ECG No STEMI.  Confirmed by Nanda Quinton [phone]) on 08/31/2018 12:57:16 PM       ____________________________________________  RADIOLOGY  Dg Chest 2 View  Result Date: 08/31/2018 CLINICAL DATA:  76 year old male with history of right-sided chest pain for 1 week. Former smoker. EXAM: CHEST - 2 VIEW COMPARISON:  Chest x-ray 09/07/2016. FINDINGS: Linear scarring in the left mid lung, stable compared to the prior examination. No acute consolidative airspace disease. No pleural effusions. No evidence of pulmonary edema. Heart size is borderline enlarged. Upper mediastinal contours are within normal limits. Aortic atherosclerosis. Right internal jugular single-lumen porta cath with tip terminating in the mid superior vena cava. IMPRESSION: 1. No radiographic evidence of acute cardiopulmonary disease. 2. Aortic atherosclerosis. 3. Support apparatus, as above. Electronically Signed   By: Vinnie Langton M.D.   On: 08/31/2018 13:12   Ct Angio Chest Pe W And/or Wo Contrast  Result Date: 08/31/2018 CLINICAL DATA:  Right-sided chest pain. EXAM: CT ANGIOGRAPHY CHEST WITH CONTRAST TECHNIQUE: Multidetector CT imaging of the chest was performed using the standard protocol during bolus administration of intravenous contrast. Multiplanar CT image reconstructions and MIPs were obtained to evaluate the vascular anatomy. CONTRAST:  3mL ISOVUE-370 IOPAMIDOL (ISOVUE-370) INJECTION 76% COMPARISON:  Chest x-ray dated 08/31/2018 and chest CT dated 04/14/2014 FINDINGS: Cardiovascular: There is mild cardiomegaly which is new since 2015. There is a moderate pericardial effusion which has slightly increased since 2015. Minimal aortic atherosclerosis. Power port in place. Mediastinum/Nodes: There is a 2.4 cm low-density nodule in the left  lobe of the thyroid gland which is new since 2015. There is a 17 mm soft tissue density adjacent to the left subclavian vein and left subclavian artery at the top of the aortic arch which could represent a large lymph node, new since the prior study. Lungs/Pleura: No acute abnormalities. Slight scarring at the left lung base and in the left midzone. Small pleural based nodule at the right lung base is unchanged since 2015. No effusions. Upper Abdomen: No acute abnormalities. Musculoskeletal: Since the prior CT scan of 2015 the patient has developed numerous blastic metastases in thoracic spine and in the ribs. No pathologic fractures. No visible tumor extension into the spinal canal. Review of the MIP images confirms the above findings. IMPRESSION: 1. Slightly increased moderate pericardial effusion. 2. New 2.4 cm low-density nodule in the left lobe of the thyroid gland. Thyroid ultrasound may be useful for further evaluation. 3. New 17 mm soft tissue density adjacent to the left subclavian vein and left subclavian artery at the top of the aortic arch which could represent a lymph node. 4. Numerous new blastic metastases in the thoracic spine and in the ribs. 5. No pulmonary emboli. Electronically Signed   By: Lorriane Shire M.D.   On: 08/31/2018  15:14    ____________________________________________   PROCEDURES  Procedure(s) performed:   Procedures  None ____________________________________________   INITIAL IMPRESSION / ASSESSMENT AND PLAN / ED COURSE  Pertinent labs & imaging results that were available during my care of the patient were reviewed by me and considered in my medical decision making (see chart for details).  Patient presents to the emergency department with right-sided chest pain. Patient with multiple ACS risk factors.  Patient undergoing radiation for colon cancer. PE is high my differential. D-dimer is elevated. CXR negative but CTA showing metastatic disease and moderate  pericardial effusion. Plan for troponin trending and pain control but will admit for ECHO and biomarker trending.   Discussed patient's case with Hospitalist, Dr. Joesph Fillers to request admission. Patient and family (if present) updated with plan. Care transferred to Hospitalist service.  I reviewed all nursing notes, vitals, pertinent old records, EKGs, labs, imaging (as available).  ____________________________________________  FINAL CLINICAL IMPRESSION(S) / ED DIAGNOSES  Final diagnoses:  Precordial chest pain     MEDICATIONS GIVEN DURING THIS VISIT:  Medications  ketorolac (TORADOL) 30 MG/ML injection 15 mg (15 mg Intravenous Given 08/31/18 1321)  iopamidol (ISOVUE-370) 76 % injection 100 mL (63 mLs Intravenous Contrast Given 08/31/18 1437)    Note:  This document was prepared using Dragon voice recognition software and may include unintentional dictation errors.  Nanda Quinton, MD Emergency Medicine    Long, [facility], MD 08/31/18 [phone]

## 2018-08-31 NOTE — H&P (Signed)
History and Physical    Dakota Malone BLT:[id number] DOB: Nov 12, 1940 DOA: 08/31/2018  PCP: Patient, No Pcp Per  Patient coming from: Home.  Chief Complaint: Chest pain.  HPI: Dakota Malone is a 77 y.o. male with history of metastatic colon cancer being followed by Dr. Frederico Hamman in [facility] who is receiving pelvic radiation last one last month, diabetes mellitus, hypertension, chronic pain on fentanyl patch and methadone has been experiencing right-sided chest pain which is constant not related to exertion no associated shortness of breath productive cough fever chills.  Has been persistent.  Since pain has been persistent patient came to [place].  ED Course: Admits in [facility] EKG shows normal sinus rhythm with possible alternans.  Since d-dimer was elevated patient had CT angiogram of the chest which was negative for PE but did show numerous blastic metastatic lesions involving the spine and the ribs.  There was moderate pericardial effusion which is increased from previous.  Patient admitted for further observation.  Troponin was negative.  On my exam patient not in distress but still has some chest pain on the right side.  Review of Systems: As per HPI, rest all negative.   Past Medical History:  Diagnosis Date  . Colon cancer ([place])   . Diabetes mellitus     Past Surgical History:  Procedure Laterality Date  . COLECTOMY    . COLECTOMY    . COLON SURGERY    . COLOSTOMY       reports that he has quit smoking. He has never used smokeless tobacco. He reports that he does not drink alcohol or use drugs.  Allergies  Allergen Reactions  . Sulfa Antibiotics Other (See Comments)    States messes  With my mind.    History reviewed. No pertinent family history.  Prior to Admission medications   Medication Sig Start Date End Date Taking? Authorizing Provider  citalopram (CELEXA) 20 MG tablet Take 20 mg by mouth daily.    [provider]  fentaNYL  (DURAGESIC - DOSED MCG/HR) 50 MCG/HR Place 1 patch onto the skin every 3 (three) days.    [provider]  gabapentin (NEURONTIN) 300 MG capsule Take 300 mg by mouth 3 (three) times daily.    [provider]  glipiZIDE (GLUCOTROL) 5 MG tablet Take 5 mg by mouth 2 (two) times daily before a meal.    [provider]  HYDROcodone-acetaminophen (NORCO/VICODIN) 5-325 MG tablet Take 1 tablet by mouth every 4 (four) hours as needed. 05/20/16   Mesner, Corene Cornea, MD  lisinopril (PRINIVIL,ZESTRIL) 10 MG tablet Take 10 mg by mouth daily.    [provider]  loperamide (IMODIUM) 2 MG capsule Take 2 mg by mouth 4 (four) times daily as needed.    [provider]  methadone (DOLOPHINE) 5 MG tablet Take 5 mg by mouth 2 (two) times daily.    [provider]  omeprazole (PRILOSEC) 20 MG capsule Take 20 mg by mouth daily.    [provider]    Physical Exam: Vitals:   08/31/18 1930 08/31/18 1948 08/31/18 1949 08/31/18 2208  BP: 135/76 (!) 141/82 (!) 141/82 (!) 148/79  Pulse:  (!) 51 (!) 51 (!) 51  Resp:   18 20  Temp:  98.3 F (36.8 C)  98.1 F (36.7 C)  TempSrc:    Oral  SpO2:  96% 97% 99%  Weight:      Height:  Constitutional: Moderately built and nourished. Vitals:   08/31/18 1930 08/31/18 1948 08/31/18 1949 08/31/18 2208  BP: 135/76 (!) 141/82 (!) 141/82 (!) 148/79  Pulse:  (!) 51 (!) 51 (!) 51  Resp:   18 20  Temp:  98.3 F (36.8 C)  98.1 F (36.7 C)  TempSrc:    Oral  SpO2:  96% 97% 99%  Weight:      Height:       Eyes: Anicteric no pallor. ENMT: No discharge from the ears eyes nose or mouth. Neck: No mass felt.  No neck rigidity. Respiratory: No rhonchi or crepitations. Cardiovascular: S1-S2 heard. Abdomen: Soft nontender bowel sounds present. Musculoskeletal: No edema.  No joint effusion. Skin: No rash. Neurologic: Alert awake oriented to time place and person.  Moves all extremities. Psychiatric: Appears  normal.  Normal affect.   Labs on Admission: I have personally reviewed following labs and imaging studies  CBC: Recent Labs  Lab 08/31/18 1323  WBC 4.7  HGB 10.2*  HCT 34.8*  MCV 84.1  PLT 629   Basic Metabolic Panel: Recent Labs  Lab 08/31/18 1323  NA 138  K 3.6  CL 103  CO2 28  GLUCOSE 134*  BUN 10  CREATININE 0.61  CALCIUM 8.4*   GFR: Estimated Creatinine Clearance: 78.6 mL/min (by C-G formula based on SCr of 0.61 mg/dL). Liver Function Tests: No results for input(s): AST, ALT, ALKPHOS, BILITOT, PROT, ALBUMIN in the last 168 hours. No results for input(s): LIPASE, AMYLASE in the last 168 hours. No results for input(s): AMMONIA in the last 168 hours. Coagulation Profile: No results for input(s): INR, PROTIME in the last 168 hours. Cardiac Enzymes: Recent Labs  Lab 08/31/18 1323  TROPONINI <0.03   BNP (last 3 results) No results for input(s): PROBNP in the last 8760 hours. HbA1C: No results for input(s): HGBA1C in the last 72 hours. CBG: No results for input(s): GLUCAP in the last 168 hours. Lipid Profile: No results for input(s): CHOL, HDL, LDLCALC, TRIG, CHOLHDL, LDLDIRECT in the last 72 hours. Thyroid Function Tests: No results for input(s): TSH, T4TOTAL, FREET4, T3FREE, THYROIDAB in the last 72 hours. Anemia Panel: No results for input(s): VITAMINB12, FOLATE, FERRITIN, TIBC, IRON, RETICCTPCT in the last 72 hours. Urine analysis:    Component Value Date/Time   COLORURINE YELLOW 05/20/2016 [place] 05/20/2016 1059   LABSPEC 1.022 05/20/2016 1059   PHURINE 6.5 05/20/2016 1059   GLUCOSEU NEGATIVE 05/20/2016 1059   HGBUR NEGATIVE 05/20/2016 1059   BILIRUBINUR NEGATIVE 05/20/2016 1059   KETONESUR NEGATIVE 05/20/2016 1059   PROTEINUR NEGATIVE 05/20/2016 1059   UROBILINOGEN 0.2 11/09/2012 1249   NITRITE NEGATIVE 05/20/2016 1059   LEUKOCYTESUR NEGATIVE 05/20/2016 1059   Sepsis Labs: @LABRCNTIP (procalcitonin:4,lacticidven:4) )No  results found for this or any previous visit (from the past 240 hour(s)).   Radiological Exams on Admission: Dg Chest 2 View  Result Date: 08/31/2018 CLINICAL DATA:  77 year old male with history of right-sided chest pain for 1 week. Former smoker. EXAM: CHEST - 2 VIEW COMPARISON:  Chest x-ray 09/07/2016. FINDINGS: Linear scarring in the left mid lung, stable compared to the prior examination. No acute consolidative airspace disease. No pleural effusions. No evidence of pulmonary edema. Heart size is borderline enlarged. Upper mediastinal contours are within normal limits. Aortic atherosclerosis. Right internal jugular single-lumen porta cath with tip terminating in the mid superior vena cava. IMPRESSION: 1. No radiographic evidence of acute cardiopulmonary disease. 2. Aortic atherosclerosis. 3. Support apparatus, as above. Electronically Signed  By: Vinnie Langton M.D.   On: 08/31/2018 13:12   Ct Angio Chest Pe W And/or Wo Contrast  Result Date: 08/31/2018 CLINICAL DATA:  Right-sided chest pain. EXAM: CT ANGIOGRAPHY CHEST WITH CONTRAST TECHNIQUE: Multidetector CT imaging of the chest was performed using the standard protocol during bolus administration of intravenous contrast. Multiplanar CT image reconstructions and MIPs were obtained to evaluate the vascular anatomy. CONTRAST:  81mL ISOVUE-370 IOPAMIDOL (ISOVUE-370) INJECTION 76% COMPARISON:  Chest x-ray dated 08/31/2018 and chest CT dated 04/14/2014 FINDINGS: Cardiovascular: There is mild cardiomegaly which is new since 2015. There is a moderate pericardial effusion which has slightly increased since 2015. Minimal aortic atherosclerosis. Power port in place. Mediastinum/Nodes: There is a 2.4 cm low-density nodule in the left lobe of the thyroid gland which is new since 2015. There is a 17 mm soft tissue density adjacent to the left subclavian vein and left subclavian artery at the top of the aortic arch which could represent a large lymph node, new  since the prior study. Lungs/Pleura: No acute abnormalities. Slight scarring at the left lung base and in the left midzone. Small pleural based nodule at the right lung base is unchanged since 2015. No effusions. Upper Abdomen: No acute abnormalities. Musculoskeletal: Since the prior CT scan of 2015 the patient has developed numerous blastic metastases in thoracic spine and in the ribs. No pathologic fractures. No visible tumor extension into the spinal canal. Review of the MIP images confirms the above findings. IMPRESSION: 1. Slightly increased moderate pericardial effusion. 2. New 2.4 cm low-density nodule in the left lobe of the thyroid gland. Thyroid ultrasound may be useful for further evaluation. 3. New 17 mm soft tissue density adjacent to the left subclavian vein and left subclavian artery at the top of the aortic arch which could represent a lymph node. 4. Numerous new blastic metastases in the thoracic spine and in the ribs. 5. No pulmonary emboli. Electronically Signed   By: Lorriane Shire M.D.   On: 08/31/2018 15:14    EKG: Independently reviewed.  Normal sinus rhythm with possible alternans.  Assessment/Plan Principal Problem:   Chest wall pain Active Problems:   Essential hypertension   Colon cancer (HCC)   Diabetes mellitus type 2 in nonobese (HCC)   Chest pain    1. Chest wall pain likely from metastatic lesion seen in the CAT scan with known history of metastatic colon cancer being followed by Dr. Baird Cancer at [facility].  At this time patient has been placed on pain relief medications.  Since patient's CAT scan shows worsening pericardial effusion 2D echo has been ordered.  No definite signs of any tamponade clinically.  Cycle cardiac markers. 2. Pericardial effusion see #1. 3. Chronic pain secondary to metastatic cancer on fentanyl patch gabapentin and methadone. 4. Diabetes mellitus type 2 we will keep patient on sliding scale coverage. 5. Anemia appears to be at baseline when  compared to hemoglobin in care everywhere.  Likely secondary to metastatic lesion. 6. Hypertension on lisinopril.   DVT prophylaxis: SCDs until we get results of 2D echo. Code Status: Full code. Family Communication: Discussed with patient. Disposition Plan: Home. Consults called: None. Admission status: Observation.   Rise Patience MD [facility] Pager [phone].  If 7PM-7AM, please contact night-coverage [email or web address] Password [facility]  08/31/2018, 10:33 PM

## 2018-08-31 NOTE — ED Notes (Signed)
Patient transported to CT 

## 2018-08-31 NOTE — ED Triage Notes (Signed)
Chest pain x 1 week, non radiating. Denies SOB

## 2018-08-31 NOTE — Progress Notes (Signed)
  77 yo male With history of colon cancer, diabetes and hypertension, status post prior radiation, presented to [facility] with right-sided chest pain, CTA chest without PE, but does show possible pericardial effusion and numerous new blastic metastases in the thoracic spine and in the ribs.   ACS--- much less likely, suspect patient symptoms are related to metastatic colon cancer with mets to the chest/ribs  Admit to telemetry unit at [facility], get echocardiogram to evaluate degree of pericardial effusion, patient may need radiation to his chest if the lesions to the ribs are significant part of his persistent chest symptoms---  Medical oncology and radiation oncology can be consulted at [place], [place]

## 2018-08-31 NOTE — ED Notes (Signed)
ED Provider at bedside. 

## 2018-08-31 NOTE — ED Notes (Signed)
Pt port accessed. Pt tolerated well. Sterile technique used. Blood return noted.

## 2018-09-01 ENCOUNTER — Observation Stay (HOSPITAL_BASED_OUTPATIENT_CLINIC_OR_DEPARTMENT_OTHER): Payer: Medicare Other

## 2018-09-01 DIAGNOSIS — E119 Type 2 diabetes mellitus without complications: Secondary | ICD-10-CM | POA: Diagnosis not present

## 2018-09-01 DIAGNOSIS — C801 Malignant (primary) neoplasm, unspecified: Secondary | ICD-10-CM | POA: Diagnosis not present

## 2018-09-01 DIAGNOSIS — C7951 Secondary malignant neoplasm of bone: Secondary | ICD-10-CM | POA: Diagnosis not present

## 2018-09-01 DIAGNOSIS — R0789 Other chest pain: Secondary | ICD-10-CM | POA: Diagnosis not present

## 2018-09-01 DIAGNOSIS — I361 Nonrheumatic tricuspid (valve) insufficiency: Secondary | ICD-10-CM | POA: Diagnosis not present

## 2018-09-01 DIAGNOSIS — C189 Malignant neoplasm of colon, unspecified: Secondary | ICD-10-CM | POA: Diagnosis not present

## 2018-09-01 LAB — GLUCOSE, CAPILLARY
GLUCOSE-CAPILLARY: 110 mg/dL — AB (ref 70–99)
Glucose-Capillary: 93 mg/dL (ref 70–99)
Glucose-Capillary: 155 mg/dL — ABNORMAL HIGH (ref 70–99)

## 2018-09-01 LAB — CBC WITH DIFFERENTIAL/PLATELET
Abs Immature Granulocytes: 0.01 10*3/uL (ref 0.00–0.07)
Basophils Absolute: 0 10*3/uL (ref 0.0–0.1)
Basophils Relative: 0 %
EOS ABS: 0.2 10*3/uL (ref 0.0–0.5)
Eosinophils Relative: 5 %
HCT: 33.3 % — ABNORMAL LOW (ref 39.0–52.0)
Hemoglobin: 10 g/dL — ABNORMAL LOW (ref 13.0–17.0)
Immature Granulocytes: 0 %
Lymphocytes Relative: 16 %
Lymphs Abs: 0.5 10*3/uL — ABNORMAL LOW (ref 0.7–4.0)
MCH: 25.4 pg — ABNORMAL LOW (ref 26.0–34.0)
MCHC: 30 g/dL (ref 30.0–36.0)
MCV: 84.5 fL (ref 80.0–100.0)
Monocytes Absolute: 0.4 10*3/uL (ref 0.1–1.0)
Monocytes Relative: 12 %
Neutro Abs: 2.1 10*3/uL (ref 1.7–7.7)
Neutrophils Relative %: 67 %
Platelets: 244 10*3/uL (ref 150–400)
RBC: 3.94 MIL/uL — ABNORMAL LOW (ref 4.22–5.81)
RDW: 15.9 % — AB (ref 11.5–15.5)
WBC: 3.2 10*3/uL — ABNORMAL LOW (ref 4.0–10.5)
nRBC: 0 % (ref 0.0–0.2)

## 2018-09-01 LAB — BASIC METABOLIC PANEL
ANION GAP: 6 (ref 5–15)
BUN: 11 mg/dL (ref 8–23)
CALCIUM: 8 mg/dL — AB (ref 8.9–10.3)
CO2: 31 mmol/L (ref 22–32)
Chloride: 102 mmol/L (ref 98–111)
Creatinine, Ser: 0.71 mg/dL (ref 0.61–1.24)
GFR calc Af Amer: >60 mL/min (ref >60)
GFR calc non Af Amer: >60 mL/min (ref >60)
Glucose, Bld: 106 mg/dL — ABNORMAL HIGH (ref 70–99)
Potassium: 3.8 mmol/L (ref 3.5–5.1)
Sodium: 139 mmol/L (ref 135–145)

## 2018-09-01 LAB — HEPATIC FUNCTION PANEL
ALT: 6 U/L (ref 0–44)
AST: 19 U/L (ref 15–41)
Albumin: 2.7 g/dL — ABNORMAL LOW (ref 3.5–5.0)
Alkaline Phosphatase: 118 U/L (ref 38–126)
BILIRUBIN DIRECT: 0.1 mg/dL (ref 0.0–0.2)
Indirect Bilirubin: 0.2 mg/dL — ABNORMAL LOW (ref 0.3–0.9)
Total Bilirubin: 0.3 mg/dL (ref 0.3–1.2)
Total Protein: 5.7 g/dL — ABNORMAL LOW (ref 6.5–8.1)

## 2018-09-01 LAB — TROPONIN I
Troponin I: <0.03 ng/mL (ref <0.03)
Troponin I: <0.03 ng/mL (ref <0.03)
Troponin I: <0.03 ng/mL (ref <0.03)

## 2018-09-01 LAB — ECHOCARDIOGRAM COMPLETE
Height: 69 in
Weight: 2880 oz

## 2018-09-01 MED ORDER — HEPARIN SOD (PORK) LOCK FLUSH 100 UNIT/ML IV SOLN
500.0000 [IU] | INTRAVENOUS | Status: AC | PRN
  Administered 2018-09-01: 500 [IU]

## 2018-09-01 MED ORDER — SODIUM CHLORIDE 0.9% FLUSH
10.0000 mL | INTRAVENOUS | Status: DC | PRN
  Administered 2018-09-01 (×3): 10 mL
  Filled 2018-09-01 (×3): qty 40

## 2018-09-01 MED ORDER — TRAZODONE HCL 50 MG PO TABS
50.0000 mg | ORAL_TABLET | Freq: Once | ORAL | Status: AC
  Administered 2018-09-01: 50 mg via ORAL
  Filled 2018-09-01: qty 1

## 2018-09-01 MED ORDER — METHADONE HCL 5 MG PO TABS: 5.0000 mg | ORAL_TABLET | Freq: Two times a day (BID) | ORAL | Status: AC

## 2018-09-01 NOTE — Discharge Summary (Addendum)
Physician Discharge Summary  Dakota Malone NFA:[id number] DOB: 1941-01-26 DOA: 08/31/2018  PCP: Patient, No Pcp Per  Admit date: 08/31/2018 Discharge date: 09/01/2018  Recommendations for Outpatient Follow-up:  1. Noncardiac chest pain.   2. Numerous new blastic metastases in the thoracic spine and in the rib, especially the fourth rib right anterior 3. New 2.4 cm low-density nodule in the left lobe of the thyroid gland. Thyroid ultrasound may be useful for further evaluation 4. New 17 mm soft tissue density adjacent to the left subclavian vein and left subclavian artery at the top of the aortic arch which could represent a lymph node.    Follow-up Information    Zebedee Iba., MD Follow up.   Specialty:  Hematology and Oncology Why:  Office will contact you with an appointment for 12/31. Contact information: [address] [place] [address] [phone]            Discharge Diagnoses: Principal diagnosis is #1 1. Chest pain, right-sided, present 1 week 2. Small to moderate pericardial effusion by echocardiogram. Chronic pain secondary to metastatic cancer 3. Diabetes mellitus type 2 4. Anemia of chronic disease  Discharge Condition: improved Disposition: home  Diet recommendation: diabetic diet  Filed Weights   08/31/18 1239  Weight: 81.6 kg    History of present illness:  77 year old man PMH metastatic colon cancer, finished radiation to the pelvis 07/2018, chronic pain on fentanyl patch and methadone, diabetes mellitus, presented with a right-sided chest pain constant for 1 week.  Placed in observation for chest pain evaluation.  Hospital Course:  Troponins were negative, ACS was ruled out.  Pain secondary to metastatic disease to the ribs and thoracic spine.  EKG showed no acute changes although some features consistent with electrical alternans, discussed with Dr. Percival Spanish cardiology, given the patient's absence of symptoms and clinical signs as  well as reassuring echocardiogram, the EKG is of no significance.  Echocardiogram revealed a small to moderate pericardial effusion without evidence of hemodynamic compromise.  Vital signs stable.  Sinus bradycardia noted, on review of previous office visits, patient has a history of sinus bradycardia.  Chest pain improved with oral analgesia.  I called the office of his oncologist Dr. Baird Cancer, he is currently out of town, however his assistant will contact the patient for an office appointment for tomorrow for further evaluation of musculoskeletal pain secondary to malignancy.  Chest pain, right-sided, present 1 week. Secondary to metastatic rib lesions, particularly fourth rib on the right side anteriorly extending to the sternum with heavy tumor burden per discussion with Dr. Zigmund [place].  Troponins are negative, EKG is nonacute, and no evidence to suggest ACS.  CT negative for PE.  Echocardiogram is reassuring. --Continue pain control, continue methadone, fentanyl patch --No further inpatient evaluation suggested  Small to moderate pericardial effusion by echocardiogram.  This has been present since at least 2015 by review of imaging studies.  There is no sonographic or clinical features to suggest hemodynamic compromise. --Given the chronicity of this and lack of concerning features, no further evaluation is suggested at this point.  Chronic pain secondary to metastatic cancer --Continue fentanyl, methadone, gabapentin, hydrocodone  Diabetes mellitus type 2, continue sliding-scale --CBG stable  Anemia of chronic disease --Appears stable  Procedures:  Echo Study Conclusions  - Left ventricle: The cavity size was normal. Wall thickness was increased in a pattern of mild LVH. Systolic function was normal. The estimated ejection fraction was in the range of 55% to 60%. Wall motion was normal;  there were no regional wall motion abnormalities. Doppler parameters are consistent with  abnormal left ventricular relaxation (grade 1 diastolic dysfunction). - Mitral valve: There was trivial regurgitation. - Left atrium: The atrium was severely dilated. Volume/bsa, S: 53.8 ml/m^2. - Tricuspid valve: There was mild regurgitation. - Pericardium, extracardiac: A small to moderate (1.3cm) pericardial effusion was identified posterior to the heart. There was no evidence of hemodynamic compromise.  Today's assessment: See progress note same day  Discharge Instructions The patient actually takes methadone 5 mg twice daily, I have updated the medical reconciliation to reflect this, no new prescription was given. Discharge Instructions    Diet Carb Modified   Complete by:  As directed    Discharge instructions   Complete by:  As directed    Call your physician or seek immediate medical attention for increased pain, shortness of breath, passing out or worsening of condition.   Increase activity slowly   Complete by:  As directed      Allergies as of 09/01/2018      Reactions   Sulfa Antibiotics Other (See Comments)   States messes  With my mind.      Medication List    TAKE these medications   citalopram 20 MG tablet Commonly known as:  CELEXA Take 20 mg by mouth at bedtime.   fentaNYL 100 MCG/HR Commonly known as:  DURAGESIC - dosed mcg/hr Place 200 mcg onto the skin every 3 (three) days.   gabapentin 300 MG capsule Commonly known as:  NEURONTIN Take 300 mg by mouth 3 (three) times daily.   glipiZIDE 5 MG tablet Commonly known as:  GLUCOTROL Take 5 mg by mouth 2 (two) times daily before a meal.   HYDROcodone-acetaminophen 5-325 MG tablet Commonly known as:  NORCO/VICODIN Take 1 tablet by mouth every 4 (four) hours as needed. What changed:  reasons to take this   lisinopril 10 MG tablet Commonly known as:  PRINIVIL,ZESTRIL Take 10 mg by mouth at bedtime.   methadone 5 MG tablet Commonly known as:  DOLOPHINE Take 1 tablet (5 mg total) by mouth  2 (two) times daily. What changed:    how much to take  when to take this   omeprazole 20 MG capsule Commonly known as:  PRILOSEC Take 20 mg by mouth at bedtime.      Allergies  Allergen Reactions  . Sulfa Antibiotics Other (See Comments)    States messes  With my mind.    The results of significant diagnostics from this hospitalization (including imaging, microbiology, ancillary and laboratory) are listed below for reference.    Significant Diagnostic Studies: Dg Chest 2 View  Result Date: 08/31/2018 CLINICAL DATA:  77 year old male with history of right-sided chest pain for 1 week. Former smoker. EXAM: CHEST - 2 VIEW COMPARISON:  Chest x-ray 09/07/2016. FINDINGS: Linear scarring in the left mid lung, stable compared to the prior examination. No acute consolidative airspace disease. No pleural effusions. No evidence of pulmonary edema. Heart size is borderline enlarged. Upper mediastinal contours are within normal limits. Aortic atherosclerosis. Right internal jugular single-lumen porta cath with tip terminating in the mid superior vena cava. IMPRESSION: 1. No radiographic evidence of acute cardiopulmonary disease. 2. Aortic atherosclerosis. 3. Support apparatus, as above. Electronically Signed   By: Vinnie Langton M.D.   On: 08/31/2018 13:12   Ct Angio Chest Pe W And/or Wo Contrast  Result Date: 08/31/2018 CLINICAL DATA:  Right-sided chest pain. EXAM: CT ANGIOGRAPHY CHEST WITH CONTRAST TECHNIQUE: Multidetector CT imaging  of the chest was performed using the standard protocol during bolus administration of intravenous contrast. Multiplanar CT image reconstructions and MIPs were obtained to evaluate the vascular anatomy. CONTRAST:  57mL ISOVUE-370 IOPAMIDOL (ISOVUE-370) INJECTION 76% COMPARISON:  Chest x-ray dated 08/31/2018 and chest CT dated 04/14/2014 FINDINGS: Cardiovascular: There is mild cardiomegaly which is new since 2015. There is a moderate pericardial effusion which has  slightly increased since 2015. Minimal aortic atherosclerosis. Power port in place. Mediastinum/Nodes: There is a 2.4 cm low-density nodule in the left lobe of the thyroid gland which is new since 2015. There is a 17 mm soft tissue density adjacent to the left subclavian vein and left subclavian artery at the top of the aortic arch which could represent a large lymph node, new since the prior study. Lungs/Pleura: No acute abnormalities. Slight scarring at the left lung base and in the left midzone. Small pleural based nodule at the right lung base is unchanged since 2015. No effusions. Upper Abdomen: No acute abnormalities. Musculoskeletal: Since the prior CT scan of 2015 the patient has developed numerous blastic metastases in thoracic spine and in the ribs. No pathologic fractures. No visible tumor extension into the spinal canal. Review of the MIP images confirms the above findings. IMPRESSION: 1. Slightly increased moderate pericardial effusion. 2. New 2.4 cm low-density nodule in the left lobe of the thyroid gland. Thyroid ultrasound may be useful for further evaluation. 3. New 17 mm soft tissue density adjacent to the left subclavian vein and left subclavian artery at the top of the aortic arch which could represent a lymph node. 4. Numerous new blastic metastases in the thoracic spine and in the ribs. 5. No pulmonary emboli. Electronically Signed   By: Lorriane Shire M.D.   On: 08/31/2018 15:14   Labs: Basic Metabolic Panel: Recent Labs  Lab 08/31/18 1323 09/01/18 0616  NA 138 139  K 3.6 3.8  CL 103 102  CO2 28 31  GLUCOSE 134* 106*  BUN 10 11  CREATININE 0.61 0.71  CALCIUM 8.4* 8.0*   Liver Function Tests: Recent Labs  Lab 09/01/18 0616  AST 19  ALT 6  ALKPHOS 118  BILITOT 0.3  PROT 5.7*  ALBUMIN 2.7*   CBC: Recent Labs  Lab 08/31/18 1323 09/01/18 0616  WBC 4.7 3.2*  NEUTROABS  --  2.1  HGB 10.2* 10.0*  HCT 34.8* 33.3*  MCV 84.1 84.5  PLT 237 244   Cardiac  Enzymes: Recent Labs  Lab 08/31/18 1323 09/01/18 0020 09/01/18 0616 09/01/18 1237  TROPONINI <0.03 <0.03 <0.03 <0.03    CBG: Recent Labs  Lab 09/01/18 0740 09/01/18 1212 09/01/18 1654  GLUCAP 93 155* 110*    Principal Problem:   Chest wall pain Active Problems:   Essential hypertension   Colon cancer (HCC)   Diabetes mellitus type 2 in nonobese [facility])   Chest pain   Time coordinating discharge: 45 minutes  Signed:  Murray Hodgkins, MD [facility] 09/01/2018, 5:10 PM

## 2018-09-01 NOTE — Progress Notes (Signed)
PROGRESS NOTE  Dakota Malone SHF:[id number] DOB: Sep 14, 1940 DOA: 08/31/2018 PCP: Patient, No Pcp Per  Brief History   77 year old man PMH metastatic colon cancer, finished radiation to the pelvis 07/2018, chronic pain on fentanyl patch and methadone, diabetes mellitus, presented with a right-sided chest pain constant for 1 week.  Placed in observation for chest pain evaluation.  Assessment/Plan Chest pain, right-sided, present 1 week.  Most likely secondary to metastatic disease and rib lesions.  Troponins are negative, EKG is nonacute, and no evidence to suggest ACS.  CT negative for PE.  Echocardiogram is reassuring. --Continue pain control, continue methadone, fentanyl patch --No further inpatient evaluation suggested  Small to moderate pericardial effusion by echocardiogram.  This has been present since at least 2015 by review of imaging studies.  There is no sonographic or clinical features to suggest hemodynamic compromise. --Given the chronicity of this and lack of concerning features, no further evaluation is suggested at this point.  Chronic pain secondary to metastatic cancer --Continue fentanyl, methadone, gabapentin, hydrocodone  Diabetes mellitus type 2, continue sliding-scale --CBG stable  Anemia of chronic disease --Appears stable   Will discuss with the patient's oncologist Dr. Verner Chol  DVT prophylaxis: SCDs Code Status: Full Family Communication: none Disposition Plan: home    Murray Hodgkins, MD  [facility] Direct contact: [phone] --[address] [place]  --[email or web address]; password [email or web address]  7PM-7AM contact night coverage as above 09/01/2018, 2:33 PM  LOS: 0 days   Consultants:    Procedures:  Echo Study Conclusions  - Left ventricle: The cavity size was normal. Wall thickness was   increased in a pattern of mild LVH. Systolic function was normal.   The estimated ejection fraction was in the range of 55% to 60%.   Wall motion was  normal; there were no regional wall motion   abnormalities. Doppler parameters are consistent with abnormal   left ventricular relaxation (grade 1 diastolic dysfunction). - Mitral valve: There was trivial regurgitation. - Left atrium: The atrium was severely dilated. Volume/bsa, S: 53.8   ml/m^2. - Tricuspid valve: There was mild regurgitation. - Pericardium, extracardiac: A small to moderate (1.3cm)   pericardial effusion was identified posterior to the heart. There   was no evidence of hemodynamic compromise.  Antimicrobials:    Interval history/Subjective: Reports ongoing pain on the right side of his chest, shoulder.  No new shortness of breath.  No other complaints.  Pain has been present for about 1 week.  Objective: Vitals:  Vitals:   08/31/18 2208 09/01/18 0521  BP: (!) 148/79 (!) 141/68  Pulse: (!) 51 (!) 45  Resp: 20   Temp: 98.1 F (36.7 C) 97.8 F (36.6 C)  SpO2: 99% 99%    Exam:  Constitutional:  . Appears calm, mildly uncomfortable, nontoxic Eyes:  . pupils and irises appear normal ENMT:  . grossly normal hearing  . Lips appear normal Respiratory:  . CTA bilaterally, no w/r/r.  . Respiratory effort normal. Cardiovascular:  . RRR, no m/r/g . No LE extremity edema   Skin:  . No rashes, lesions, ulcers over chest wall or right arm . palpation of skin: no induration or nodules Psychiatric:  . Mental status o Mood, affect appropriate  I have personally reviewed the following:   Data: . Troponins negative . Basic metabolic panel unremarkable.  LFTs unremarkable. . Hemoglobin stable at 10.0 . D-dimer was elevated . CTA chest noted . Echocardiogram noted  Scheduled Meds: . citalopram  20 mg Oral Daily  . [START  ON 09/02/2018] fentaNYL  200 mcg Transdermal Q72H  . gabapentin  300 mg Oral TID  . insulin aspart  0-9 Units Subcutaneous TID WC  . lisinopril  10 mg Oral Daily  . methadone  5 mg Oral BID  . pantoprazole  40 mg Oral Daily    Continuous Infusions:  Principal Problem:   Chest wall pain Active Problems:   Essential hypertension   Colon cancer (HCC)   Diabetes mellitus type 2 in nonobese (HCC)   Chest pain   LOS: 0 days

## 2018-09-02 DIAGNOSIS — G894 Chronic pain syndrome: Secondary | ICD-10-CM | POA: Diagnosis not present

## 2018-09-02 DIAGNOSIS — C7951 Secondary malignant neoplasm of bone: Secondary | ICD-10-CM | POA: Diagnosis not present

## 2018-09-02 DIAGNOSIS — Z79891 Long term (current) use of opiate analgesic: Secondary | ICD-10-CM | POA: Diagnosis not present

## 2018-09-02 DIAGNOSIS — C189 Malignant neoplasm of colon, unspecified: Secondary | ICD-10-CM | POA: Diagnosis not present

## 2018-09-10 DIAGNOSIS — Z882 Allergy status to sulfonamides status: Secondary | ICD-10-CM | POA: Diagnosis not present

## 2018-09-10 DIAGNOSIS — Z510 Encounter for antineoplastic radiation therapy: Secondary | ICD-10-CM | POA: Diagnosis not present

## 2018-09-10 DIAGNOSIS — M47817 Spondylosis without myelopathy or radiculopathy, lumbosacral region: Secondary | ICD-10-CM | POA: Diagnosis not present

## 2018-09-10 DIAGNOSIS — C188 Malignant neoplasm of overlapping sites of colon: Secondary | ICD-10-CM | POA: Diagnosis not present

## 2018-09-10 DIAGNOSIS — R600 Localized edema: Secondary | ICD-10-CM | POA: Diagnosis not present

## 2018-09-10 DIAGNOSIS — M79604 Pain in right leg: Secondary | ICD-10-CM | POA: Diagnosis not present

## 2018-09-10 DIAGNOSIS — M899 Disorder of bone, unspecified: Secondary | ICD-10-CM | POA: Diagnosis not present

## 2018-09-10 DIAGNOSIS — M25552 Pain in left hip: Secondary | ICD-10-CM | POA: Diagnosis not present

## 2018-09-10 DIAGNOSIS — R590 Localized enlarged lymph nodes: Secondary | ICD-10-CM | POA: Diagnosis not present

## 2018-09-10 DIAGNOSIS — C189 Malignant neoplasm of colon, unspecified: Secondary | ICD-10-CM | POA: Diagnosis not present

## 2018-09-10 DIAGNOSIS — C7951 Secondary malignant neoplasm of bone: Secondary | ICD-10-CM | POA: Diagnosis not present

## 2018-09-10 DIAGNOSIS — G894 Chronic pain syndrome: Secondary | ICD-10-CM | POA: Diagnosis not present

## 2018-09-10 DIAGNOSIS — C182 Malignant neoplasm of ascending colon: Secondary | ICD-10-CM | POA: Diagnosis not present

## 2018-09-10 DIAGNOSIS — M79605 Pain in left leg: Secondary | ICD-10-CM | POA: Diagnosis not present

## 2018-09-10 DIAGNOSIS — C801 Malignant (primary) neoplasm, unspecified: Secondary | ICD-10-CM | POA: Diagnosis not present

## 2018-09-15 DIAGNOSIS — C189 Malignant neoplasm of colon, unspecified: Secondary | ICD-10-CM | POA: Diagnosis not present

## 2018-09-15 DIAGNOSIS — C7951 Secondary malignant neoplasm of bone: Secondary | ICD-10-CM | POA: Diagnosis not present

## 2018-09-15 DIAGNOSIS — Z510 Encounter for antineoplastic radiation therapy: Secondary | ICD-10-CM | POA: Diagnosis not present

## 2018-09-17 DIAGNOSIS — Z510 Encounter for antineoplastic radiation therapy: Secondary | ICD-10-CM | POA: Diagnosis not present

## 2018-09-17 DIAGNOSIS — C189 Malignant neoplasm of colon, unspecified: Secondary | ICD-10-CM | POA: Diagnosis not present

## 2018-09-17 DIAGNOSIS — C7951 Secondary malignant neoplasm of bone: Secondary | ICD-10-CM | POA: Diagnosis not present

## 2018-09-18 DIAGNOSIS — Z510 Encounter for antineoplastic radiation therapy: Secondary | ICD-10-CM | POA: Diagnosis not present

## 2018-09-18 DIAGNOSIS — C189 Malignant neoplasm of colon, unspecified: Secondary | ICD-10-CM | POA: Diagnosis not present

## 2018-09-18 DIAGNOSIS — C7951 Secondary malignant neoplasm of bone: Secondary | ICD-10-CM | POA: Diagnosis not present

## 2018-09-19 DIAGNOSIS — C7951 Secondary malignant neoplasm of bone: Secondary | ICD-10-CM | POA: Diagnosis not present

## 2018-09-19 DIAGNOSIS — Z510 Encounter for antineoplastic radiation therapy: Secondary | ICD-10-CM | POA: Diagnosis not present

## 2018-09-19 DIAGNOSIS — C189 Malignant neoplasm of colon, unspecified: Secondary | ICD-10-CM | POA: Diagnosis not present

## 2018-09-23 DIAGNOSIS — C7951 Secondary malignant neoplasm of bone: Secondary | ICD-10-CM | POA: Diagnosis not present

## 2018-09-23 DIAGNOSIS — C189 Malignant neoplasm of colon, unspecified: Secondary | ICD-10-CM | POA: Diagnosis not present

## 2018-09-23 DIAGNOSIS — Z510 Encounter for antineoplastic radiation therapy: Secondary | ICD-10-CM | POA: Diagnosis not present

## 2018-09-24 DIAGNOSIS — C189 Malignant neoplasm of colon, unspecified: Secondary | ICD-10-CM | POA: Diagnosis not present

## 2018-09-24 DIAGNOSIS — C7951 Secondary malignant neoplasm of bone: Secondary | ICD-10-CM | POA: Diagnosis not present

## 2018-09-24 DIAGNOSIS — Z510 Encounter for antineoplastic radiation therapy: Secondary | ICD-10-CM | POA: Diagnosis not present

## 2018-09-29 DIAGNOSIS — G894 Chronic pain syndrome: Secondary | ICD-10-CM | POA: Diagnosis not present

## 2018-09-29 DIAGNOSIS — G893 Neoplasm related pain (acute) (chronic): Secondary | ICD-10-CM | POA: Diagnosis not present

## 2018-09-29 DIAGNOSIS — C7951 Secondary malignant neoplasm of bone: Secondary | ICD-10-CM | POA: Diagnosis not present

## 2018-09-29 DIAGNOSIS — C188 Malignant neoplasm of overlapping sites of colon: Secondary | ICD-10-CM | POA: Diagnosis not present

## 2018-09-29 DIAGNOSIS — C189 Malignant neoplasm of colon, unspecified: Secondary | ICD-10-CM | POA: Diagnosis not present

## 2018-09-29 DIAGNOSIS — C482 Malignant neoplasm of peritoneum, unspecified: Secondary | ICD-10-CM | POA: Diagnosis not present

## 2018-09-29 DIAGNOSIS — Z923 Personal history of irradiation: Secondary | ICD-10-CM | POA: Diagnosis not present

## 2018-09-29 DIAGNOSIS — Z79891 Long term (current) use of opiate analgesic: Secondary | ICD-10-CM | POA: Diagnosis not present

## 2018-10-10 DIAGNOSIS — C7951 Secondary malignant neoplasm of bone: Secondary | ICD-10-CM | POA: Diagnosis not present

## 2018-10-13 DIAGNOSIS — G893 Neoplasm related pain (acute) (chronic): Secondary | ICD-10-CM | POA: Diagnosis not present

## 2018-10-13 DIAGNOSIS — Z923 Personal history of irradiation: Secondary | ICD-10-CM | POA: Diagnosis not present

## 2018-10-13 DIAGNOSIS — G894 Chronic pain syndrome: Secondary | ICD-10-CM | POA: Diagnosis not present

## 2018-10-13 DIAGNOSIS — C7951 Secondary malignant neoplasm of bone: Secondary | ICD-10-CM | POA: Diagnosis not present

## 2018-10-13 DIAGNOSIS — C188 Malignant neoplasm of overlapping sites of colon: Secondary | ICD-10-CM | POA: Diagnosis not present

## 2018-10-13 DIAGNOSIS — C786 Secondary malignant neoplasm of retroperitoneum and peritoneum: Secondary | ICD-10-CM | POA: Diagnosis not present

## 2018-10-13 DIAGNOSIS — Z79891 Long term (current) use of opiate analgesic: Secondary | ICD-10-CM | POA: Diagnosis not present

## 2019-01-20 DIAGNOSIS — R0902 Hypoxemia: Secondary | ICD-10-CM | POA: Diagnosis not present

## 2019-01-20 DIAGNOSIS — Z743 Need for continuous supervision: Secondary | ICD-10-CM | POA: Diagnosis not present

## 2019-01-20 DIAGNOSIS — R279 Unspecified lack of coordination: Secondary | ICD-10-CM | POA: Diagnosis not present

## 2019-02-02 DEATH — deceased
